# Patient Record
Sex: Male | Born: 1995 | Race: White | Marital: Single | Smoking: Never smoker
Health system: Southern US, Community
[De-identification: ages and names within clinical notes are randomized; demographics above are authoritative.]

## PROBLEM LIST (undated history)

## (undated) DIAGNOSIS — I739 Peripheral vascular disease, unspecified: Secondary | ICD-10-CM

## (undated) HISTORY — DX: Peripheral vascular disease, unspecified: I73.9

---

## 2019-04-05 ENCOUNTER — Other Ambulatory Visit: Payer: Self-pay | Admitting: Surgical Oncology

## 2019-04-05 DIAGNOSIS — K650 Generalized (acute) peritonitis: Secondary | ICD-10-CM

## 2019-04-20 ENCOUNTER — Encounter: Payer: Self-pay | Admitting: *Deleted

## 2019-04-20 ENCOUNTER — Ambulatory Visit
Admission: RE | Admit: 2019-04-20 | Discharge: 2019-04-20 | Disposition: A | Discharge status: Home / Self Care | Payer: BLUE CROSS/BLUE SHIELD | Source: Ambulatory Visit | Attending: Surgical Oncology | Admitting: Surgical Oncology

## 2019-04-20 ENCOUNTER — Other Ambulatory Visit: Payer: Self-pay

## 2019-04-20 DIAGNOSIS — K650 Generalized (acute) peritonitis: Secondary | ICD-10-CM

## 2019-04-20 HISTORY — PX: IR RADIOLOGIST EVAL & MGMT: IMG5224

## 2019-04-20 MED ORDER — IOPAMIDOL (ISOVUE-300) INJECTION 61%
125.0000 mL | Freq: Once | INTRAVENOUS | Status: AC | PRN
  Administered 2019-04-20: 125 mL via INTRAVENOUS

## 2019-04-20 NOTE — Progress Notes (Signed)
Chief Complaint: Patient was seen in consultation today for abscess drain at the request of [facility]  Referring Physician(s): Arredondo,Mark  History of Present Illness: Carlos Russell is a 23 y.o. male With a past history of appendicitis status post appendectomy.  He developed a chronic right posterior perihepatic fluid collection which was drained percutaneously on 04/02/2019.  Nearly 100 mL of frankly purulent material was aspirated at that time.  The gentleman has subsequently been maintaining his drainage catheter and flushing it once daily.  Last week, drain output was approximately 10 mL/day.  This week drain output is only 5 mL/day of serosanguineous fluid.  This corresponds with the amounts of flush being injected each day.  He denies fever, chills, abdominal or flank pain.   Allergies: Patient has no allergy information on record.  Medications: Prior to Admission medications   Not on File     No family history on file.  Social History   Socioeconomic History  . Marital status: Single    Spouse name: Not on file  . Number of children: Not on file  . Years of education: Not on file  . Highest education level: Not on file  Occupational History  . Not on file  Social Needs  . Financial resource strain: Not on file  . Food insecurity    Worry: Not on file    Inability: Not on file  . Transportation needs    Medical: Not on file    Non-medical: Not on file  Tobacco Use  . Smoking status: Not on file  Substance and Sexual Activity  . Alcohol use: Not on file  . Drug use: Not on file  . Sexual activity: Not on file  Lifestyle  . Physical activity    Days per week: Not on file    Minutes per session: Not on file  . Stress: Not on file  Relationships  . Social Herbalist on phone: Not on file    Gets together: Not on file    Attends religious service: Not on file    Active member of club or organization: Not on file    Attends  meetings of clubs or organizations: Not on file    Relationship status: Not on file  Other Topics Concern  . Not on file  Social History Narrative  . Not on file     Review of Systems: A 12 point ROS discussed and pertinent positives are indicated in the HPI above.  All other systems are negative.  Review of Systems  Vital Signs: There were no vitals taken for this visit.  Physical Exam Vitals signs reviewed.  Constitutional:      General: He is not in acute distress.    Appearance: Normal appearance. He is not ill-appearing.  HENT:     Head: Normocephalic and atraumatic.  Eyes:     General: No scleral icterus. Cardiovascular:     Rate and Rhythm: Normal rate.  Pulmonary:     Effort: Pulmonary effort is normal.  Abdominal:     General: Abdomen is flat.     Palpations: Abdomen is soft.     Tenderness: There is no abdominal tenderness.  Skin:    General: Skin is warm and dry.  Neurological:     Mental Status: He is alert and oriented to person, place, and time.  Psychiatric:        Mood and Affect: Mood normal.        Behavior: Behavior  normal.     Imaging: Ct Abdomen W Contrast  Result Date: 04/20/2019 CLINICAL DATA:  23 year old male with a history of right upper quadrant posterior perihepatic abscess treated by percutaneous drain placement on 04/02/2019. Patient presents today for follow-up imaging. EXAM: CT ABDOMEN WITH CONTRAST TECHNIQUE: Multidetector CT imaging of the abdomen was performed using the standard protocol following bolus administration of intravenous contrast. CONTRAST:  125mL ISOVUE-300 IOPAMIDOL (ISOVUE-300) INJECTION 61% COMPARISON:  Prior CT scan 04/01/2019 FINDINGS: Lower chest: The lung bases are clear. Visualized cardiac structures are within normal limits for size. No pericardial effusion. Unremarkable visualized distal thoracic esophagus. Hepatobiliary: Normal hepatic contour and morphology. No discrete hepatic lesions. Minimal residual  low-attenuation in hepatic segment 6 likely representing reactive edema/phlegmon. This region measures approximately 3.2 by 2.0 cm. Normal appearance of the gallbladder. No intra or extrahepatic biliary ductal dilatation. Pancreas: Unremarkable. No pancreatic ductal dilatation or surrounding inflammatory changes. Spleen: Normal in size without focal abnormality. Adrenals/Urinary Tract: Adrenal glands are unremarkable. Kidneys are normal, without renal calculi, focal lesion, or hydronephrosis. Bladder is unremarkable. Stomach/Bowel: Stomach is within normal limits. Surgical changes of prior appendectomy. No evidence of bowel wall thickening, distention, or inflammatory changes. Vascular/Lymphatic: No significant vascular findings are present. No enlarged abdominal or pelvic lymph nodes. Other: Right upper quadrant posterior perihepatic drainage catheter remains in good position. There is no visible undrained fluid collection. Soft tissue thickening is again present in the retroperitoneal space with adjacent reactive edema and the liver. No abdominal wall hernia. No ascites. Musculoskeletal: No acute fracture or aggressive appearing lytic or blastic osseous lesion. IMPRESSION: Interval resolution of right posterior perihepatic abscess cavity with well-positioned drainage catheter in place. There is a small amount of residual low-attenuation within the hepatic parenchyma likely representing secondary inflammation or phlegmon. No undrained abscess evident. Electronically Signed   By: Malachy MoanHeath  Randy Castrejon M.D.   On: 04/20/2019 12:56    Labs:  CBC: No results for input(s): WBC, HGB, HCT, PLT in the last 8760 hours.  COAGS: No results for input(s): INR, APTT in the last 8760 hours.  BMP: No results for input(s): NA, K, CL, CO2, GLUCOSE, BUN, CALCIUM, CREATININE, GFRNONAA, GFRAA in the last 8760 hours.  Invalid input(s): CMP  LIVER FUNCTION TESTS: No results for input(s): BILITOT, AST, ALT, ALKPHOS, PROT,  ALBUMIN in the last 8760 hours.  TUMOR MARKERS: No results for input(s): AFPTM, CEA, CA199, CHROMGRNA in the last 8760 hours.  Assessment and Plan:  Resolved posterior perihepatic abscess.  The drainage catheter was removed.   Electronically Signed: Malachy MoanHeath Aayra Hornbaker 04/20/2019, 1:18 PM   I spent a total of 15 Minutes in face to face in clinical consultation, greater than 50% of which was counseling/coordinating care for perihepatic abscess with drain in place.

## 2020-04-25 IMAGING — CT CT ABDOMEN WITH CONTRAST
2 of 4 series · 13 of 46 positions shown, 15 images · IV contrast (iopamidol)
Comparison: Prior CT scan 04/01/2019

CLINICAL DATA: 22-year-old male with a history of right upper
quadrant posterior perihepatic abscess treated by percutaneous drain
placement on 04/02/2019. Patient presents today for follow-up
imaging.

EXAM:
CT ABDOMEN WITH CONTRAST
TECHNIQUE: Multidetector CT imaging of the abdomen was performed using the
standard protocol following bolus administration of intravenous
contrast.
CONTRAST:  125mL NHEV5J-GWW IOPAMIDOL (NHEV5J-GWW) INJECTION 61%

[Series 2: abd with 5.00 br40 s3 axial · axial · 0.71mm/px · z∈[+1340,+1615]mm · 10 of 63 slices shown, 12 images]
[im 4/63  soft-tissue]
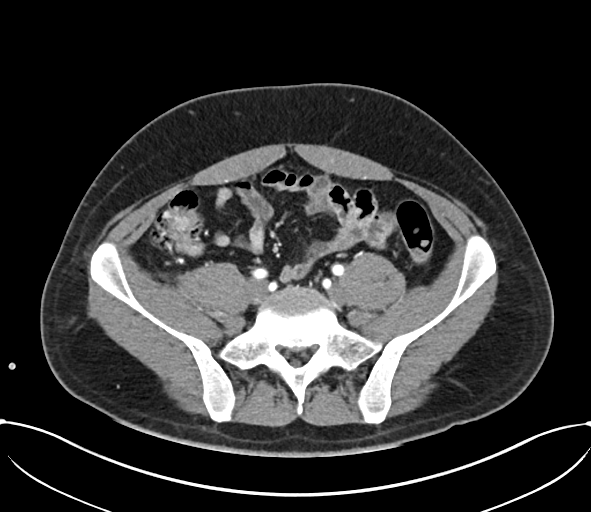
[im 4/63  bone]
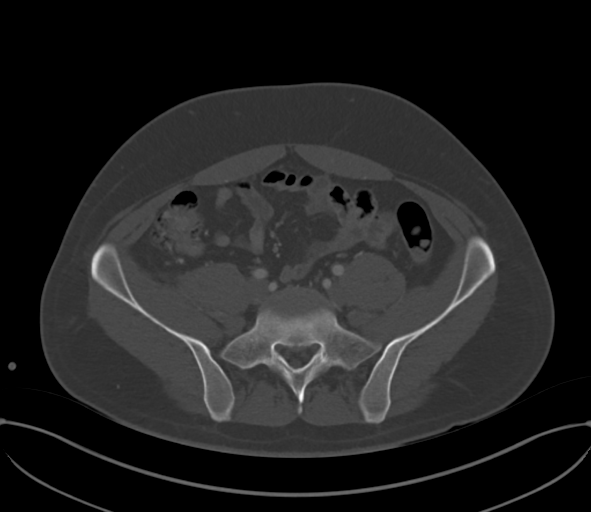
[im 10/63  soft-tissue]
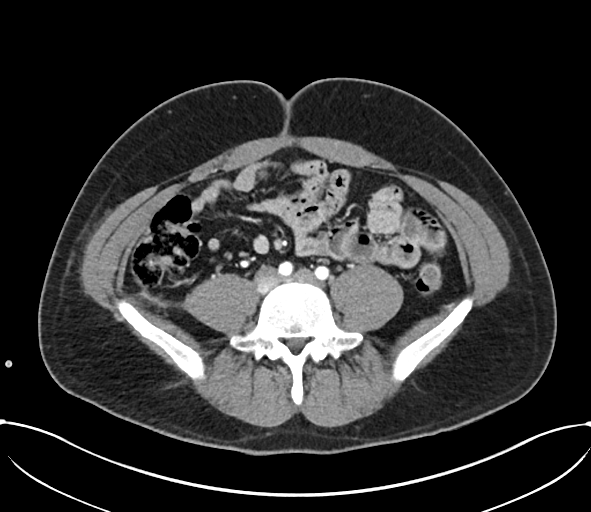
[im 16/63  soft-tissue]
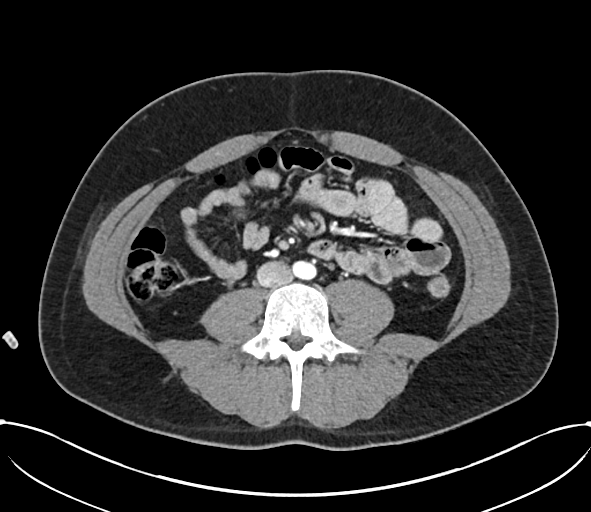
[im 22/63  soft-tissue]
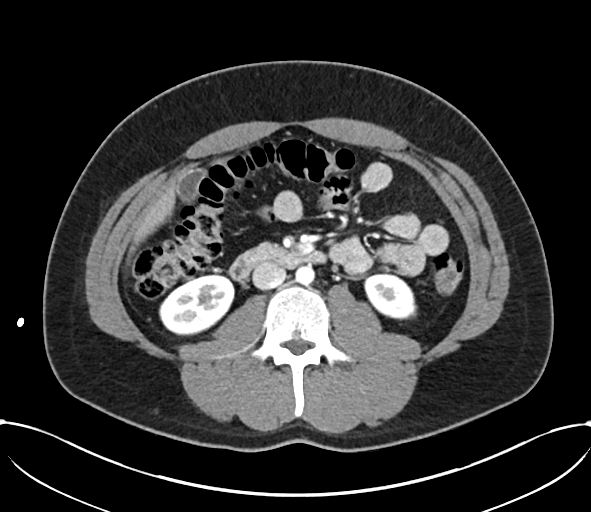
[im 28/63  soft-tissue]
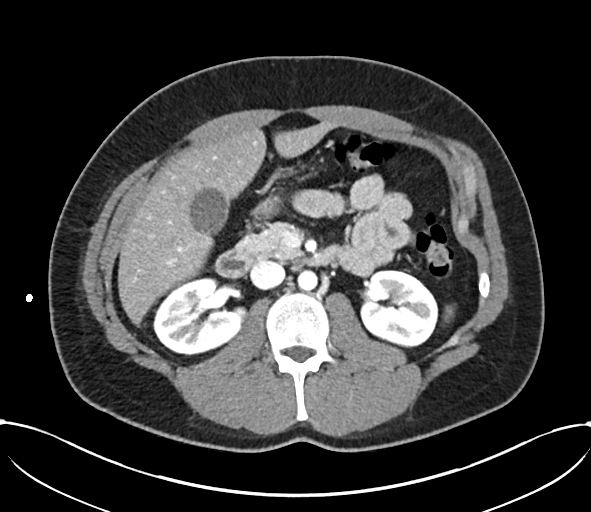
[im 35/63  soft-tissue]
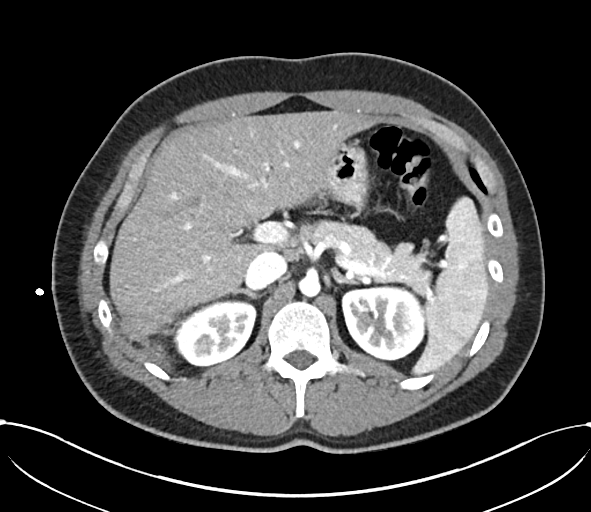
[im 41/63  soft-tissue]
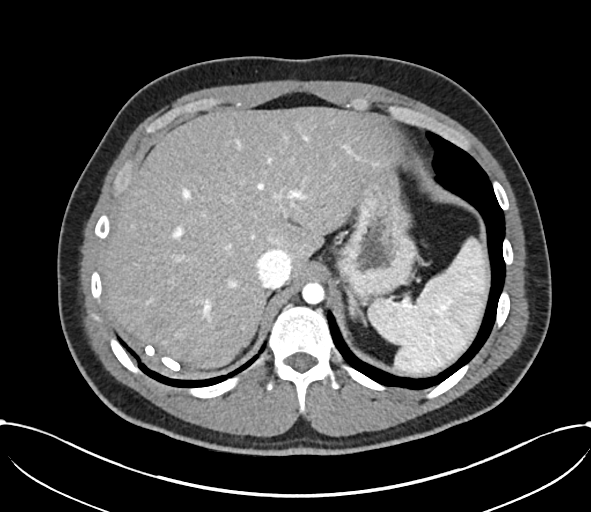
[im 47/63  soft-tissue]
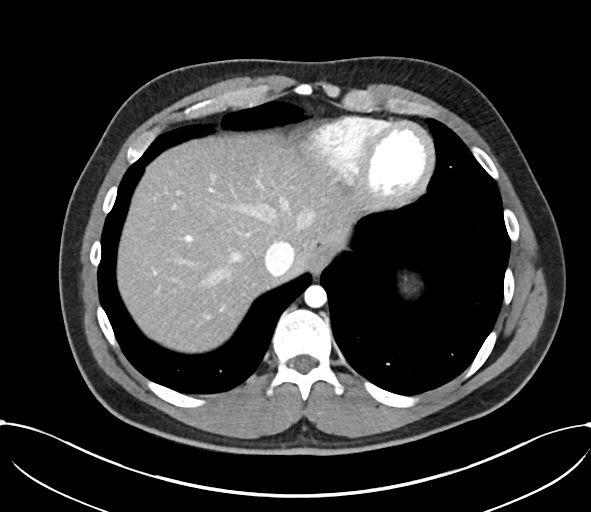
[im 53/63  soft-tissue]
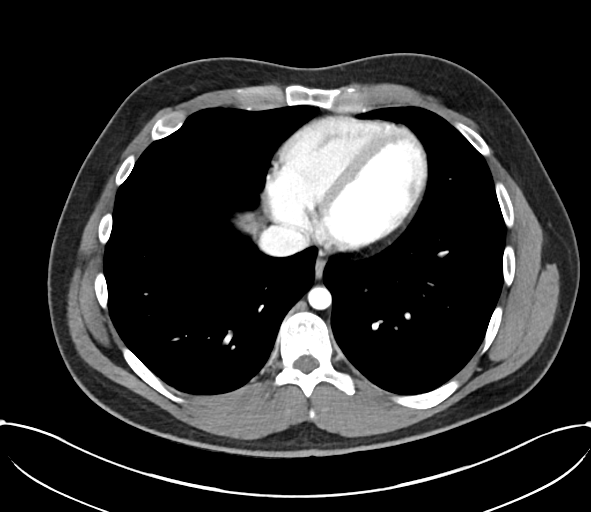
[im 53/63  bone]
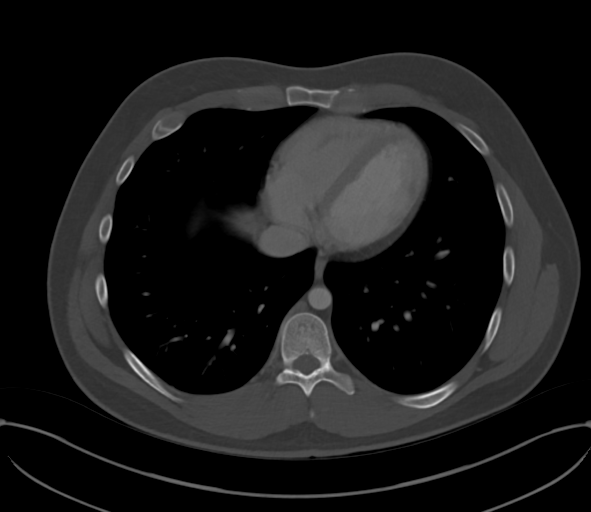
[im 59/63  soft-tissue]
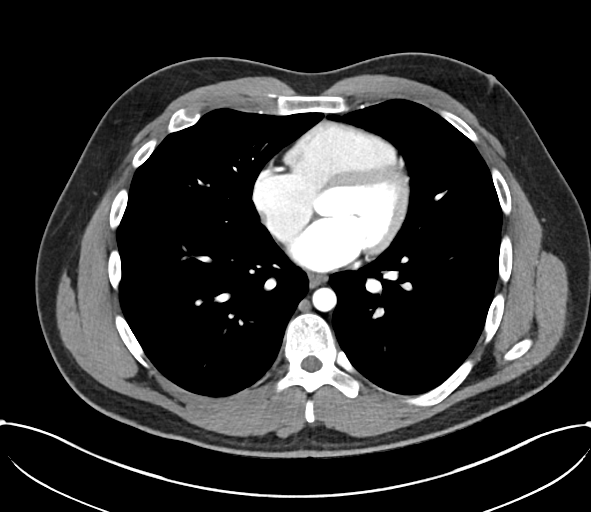

[Series 8: abd with 2.00 br40 s3 cor · coronal · 0.62mm/px · 3 of 182 slices shown]
[im 61/182  soft-tissue]
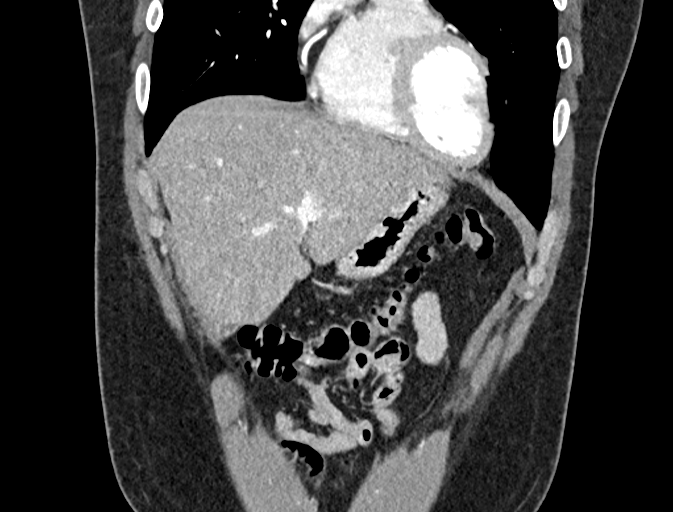
[im 81/182  soft-tissue]
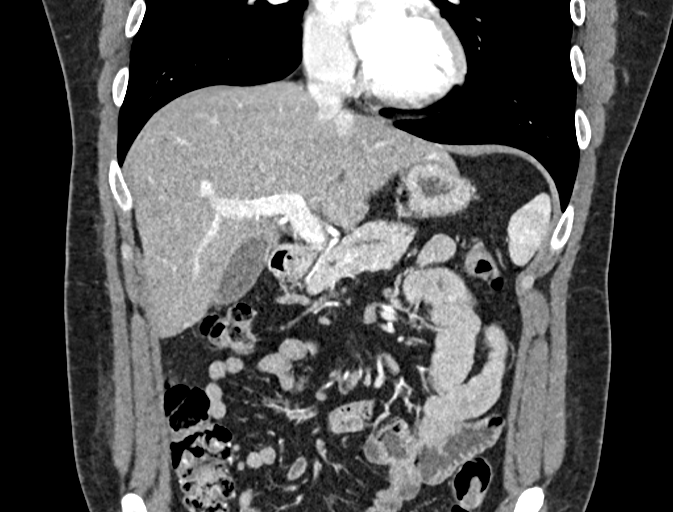
[im 101/182  soft-tissue]
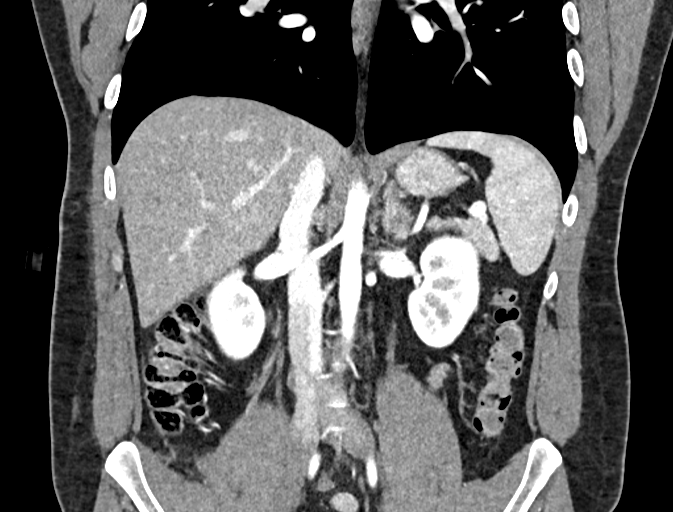

[13 of 46 positions shown; findings below may reference images not displayed]

FINDINGS: Lower chest: The lung bases are clear. Visualized cardiac structures
are within normal limits for size. No pericardial effusion.
Unremarkable visualized distal thoracic esophagus.

Hepatobiliary: Normal hepatic contour and morphology. No discrete
hepatic lesions. Minimal residual low-attenuation in hepatic segment
6 likely representing reactive edema/phlegmon. This region measures
approximately 3.2 by 2.0 cm. Normal appearance of the gallbladder.
No intra or extrahepatic biliary ductal dilatation.

Pancreas: Unremarkable. No pancreatic ductal dilatation or
surrounding inflammatory changes.

Spleen: Normal in size without focal abnormality.

Adrenals/Urinary Tract: Adrenal glands are unremarkable. Kidneys are
normal, without renal calculi, focal lesion, or hydronephrosis.
Bladder is unremarkable.

Stomach/Bowel: Stomach is within normal limits. Surgical changes of
prior appendectomy. No evidence of bowel wall thickening,
distention, or inflammatory changes.

Vascular/Lymphatic: No significant vascular findings are present. No
enlarged abdominal or pelvic lymph nodes.

Other: Right upper quadrant posterior perihepatic drainage catheter
remains in good position. There is no visible undrained fluid
collection. Soft tissue thickening is again present in the
retroperitoneal space with adjacent reactive edema and the liver. No
abdominal wall hernia. No ascites.

Musculoskeletal: No acute fracture or aggressive appearing lytic or
blastic osseous lesion.
IMPRESSION: Interval resolution of right posterior perihepatic abscess cavity
with well-positioned drainage catheter in place. There is a small
amount of residual low-attenuation within the hepatic parenchyma
likely representing secondary inflammation or phlegmon. No undrained
abscess evident.

## 2023-12-23 DIAGNOSIS — Z131 Encounter for screening for diabetes mellitus: Secondary | ICD-10-CM | POA: Diagnosis not present

## 2023-12-23 DIAGNOSIS — Z13220 Encounter for screening for lipoid disorders: Secondary | ICD-10-CM | POA: Diagnosis not present

## 2023-12-23 DIAGNOSIS — M542 Cervicalgia: Secondary | ICD-10-CM | POA: Diagnosis not present

## 2023-12-23 DIAGNOSIS — Z0000 Encounter for general adult medical examination without abnormal findings: Secondary | ICD-10-CM | POA: Diagnosis not present

## 2023-12-23 DIAGNOSIS — E669 Obesity, unspecified: Secondary | ICD-10-CM | POA: Diagnosis not present

## 2024-04-13 DIAGNOSIS — M549 Dorsalgia, unspecified: Secondary | ICD-10-CM | POA: Diagnosis not present

## 2024-04-13 DIAGNOSIS — L03012 Cellulitis of left finger: Secondary | ICD-10-CM | POA: Diagnosis not present

## 2024-04-13 DIAGNOSIS — S60362A Insect bite (nonvenomous) of left thumb, initial encounter: Secondary | ICD-10-CM | POA: Diagnosis not present

## 2024-05-04 DIAGNOSIS — E663 Overweight: Secondary | ICD-10-CM | POA: Diagnosis not present

## 2024-05-04 DIAGNOSIS — M545 Low back pain, unspecified: Secondary | ICD-10-CM | POA: Diagnosis not present

## 2024-05-04 DIAGNOSIS — Z6834 Body mass index (BMI) 34.0-34.9, adult: Secondary | ICD-10-CM | POA: Diagnosis not present

## 2024-05-25 DIAGNOSIS — M545 Low back pain, unspecified: Secondary | ICD-10-CM | POA: Diagnosis not present

## 2024-06-26 ENCOUNTER — Emergency Department (HOSPITAL_COMMUNITY): Payer: Self-pay

## 2024-06-26 ENCOUNTER — Inpatient Hospital Stay (HOSPITAL_COMMUNITY)
Admission: EM | Admit: 2024-06-26 | Discharge: 2024-07-07 | DRG: 907 | Disposition: A | Discharge status: Home / Self Care | Payer: Self-pay | Attending: Surgery | Admitting: Surgery

## 2024-06-26 DIAGNOSIS — Z91041 Radiographic dye allergy status: Secondary | ICD-10-CM | POA: Diagnosis not present

## 2024-06-26 DIAGNOSIS — X749XXA Intentional self-harm by unspecified firearm discharge, initial encounter: Secondary | ICD-10-CM | POA: Diagnosis not present

## 2024-06-26 DIAGNOSIS — S069XAA Unspecified intracranial injury with loss of consciousness status unknown, initial encounter: Secondary | ICD-10-CM | POA: Diagnosis present

## 2024-06-26 DIAGNOSIS — T79A21A Traumatic compartment syndrome of right lower extremity, initial encounter: Secondary | ICD-10-CM | POA: Diagnosis present

## 2024-06-26 DIAGNOSIS — E876 Hypokalemia: Secondary | ICD-10-CM | POA: Diagnosis present

## 2024-06-26 DIAGNOSIS — Z23 Encounter for immunization: Secondary | ICD-10-CM | POA: Diagnosis not present

## 2024-06-26 DIAGNOSIS — Z87820 Personal history of traumatic brain injury: Secondary | ICD-10-CM

## 2024-06-26 DIAGNOSIS — J9601 Acute respiratory failure with hypoxia: Secondary | ICD-10-CM | POA: Diagnosis not present

## 2024-06-26 DIAGNOSIS — N178 Other acute kidney failure: Secondary | ICD-10-CM | POA: Diagnosis not present

## 2024-06-26 DIAGNOSIS — D62 Acute posthemorrhagic anemia: Secondary | ICD-10-CM | POA: Diagnosis not present

## 2024-06-26 DIAGNOSIS — N179 Acute kidney failure, unspecified: Secondary | ICD-10-CM | POA: Diagnosis present

## 2024-06-26 DIAGNOSIS — S71101A Unspecified open wound, right thigh, initial encounter: Secondary | ICD-10-CM | POA: Diagnosis not present

## 2024-06-26 DIAGNOSIS — F6381 Intermittent explosive disorder: Secondary | ICD-10-CM | POA: Diagnosis present

## 2024-06-26 DIAGNOSIS — M21371 Foot drop, right foot: Secondary | ICD-10-CM | POA: Diagnosis present

## 2024-06-26 DIAGNOSIS — F322 Major depressive disorder, single episode, severe without psychotic features: Secondary | ICD-10-CM | POA: Diagnosis not present

## 2024-06-26 DIAGNOSIS — F3131 Bipolar disorder, current episode depressed, mild: Secondary | ICD-10-CM | POA: Diagnosis present

## 2024-06-26 DIAGNOSIS — S71131A Puncture wound without foreign body, right thigh, initial encounter: Secondary | ICD-10-CM | POA: Diagnosis present

## 2024-06-26 DIAGNOSIS — Z7982 Long term (current) use of aspirin: Secondary | ICD-10-CM

## 2024-06-26 DIAGNOSIS — S86901A Unspecified injury of unspecified muscle(s) and tendon(s) at lower leg level, right leg, initial encounter: Secondary | ICD-10-CM | POA: Diagnosis not present

## 2024-06-26 DIAGNOSIS — S71141A Puncture wound with foreign body, right thigh, initial encounter: Secondary | ICD-10-CM | POA: Diagnosis not present

## 2024-06-26 DIAGNOSIS — R58 Hemorrhage, not elsewhere classified: Secondary | ICD-10-CM | POA: Diagnosis not present

## 2024-06-26 DIAGNOSIS — E875 Hyperkalemia: Secondary | ICD-10-CM | POA: Diagnosis not present

## 2024-06-26 DIAGNOSIS — Z79899 Other long term (current) drug therapy: Secondary | ICD-10-CM | POA: Diagnosis not present

## 2024-06-26 DIAGNOSIS — S75901A Unspecified injury of unspecified blood vessel at hip and thigh level, right leg, initial encounter: Secondary | ICD-10-CM | POA: Diagnosis not present

## 2024-06-26 DIAGNOSIS — Z95820 Peripheral vascular angioplasty status with implants and grafts: Secondary | ICD-10-CM | POA: Diagnosis not present

## 2024-06-26 DIAGNOSIS — D696 Thrombocytopenia, unspecified: Secondary | ICD-10-CM | POA: Diagnosis present

## 2024-06-26 DIAGNOSIS — F3160 Bipolar disorder, current episode mixed, unspecified: Secondary | ICD-10-CM | POA: Diagnosis present

## 2024-06-26 DIAGNOSIS — S75021A Major laceration of femoral artery, right leg, initial encounter: Principal | ICD-10-CM | POA: Diagnosis present

## 2024-06-26 DIAGNOSIS — Z9911 Dependence on respirator [ventilator] status: Secondary | ICD-10-CM

## 2024-06-26 DIAGNOSIS — F10120 Alcohol abuse with intoxication, uncomplicated: Secondary | ICD-10-CM | POA: Diagnosis not present

## 2024-06-26 DIAGNOSIS — E781 Pure hyperglyceridemia: Secondary | ICD-10-CM | POA: Diagnosis present

## 2024-06-26 DIAGNOSIS — I959 Hypotension, unspecified: Secondary | ICD-10-CM | POA: Diagnosis present

## 2024-06-26 DIAGNOSIS — J9600 Acute respiratory failure, unspecified whether with hypoxia or hypercapnia: Secondary | ICD-10-CM | POA: Diagnosis present

## 2024-06-26 DIAGNOSIS — W3400XA Accidental discharge from unspecified firearms or gun, initial encounter: Secondary | ICD-10-CM | POA: Diagnosis not present

## 2024-06-26 DIAGNOSIS — Z791 Long term (current) use of non-steroidal anti-inflammatories (NSAID): Secondary | ICD-10-CM

## 2024-06-26 DIAGNOSIS — Y249XXA Unspecified firearm discharge, undetermined intent, initial encounter: Secondary | ICD-10-CM | POA: Diagnosis not present

## 2024-06-26 DIAGNOSIS — Z818 Family history of other mental and behavioral disorders: Secondary | ICD-10-CM

## 2024-06-26 DIAGNOSIS — Z882 Allergy status to sulfonamides status: Secondary | ICD-10-CM | POA: Diagnosis not present

## 2024-06-26 DIAGNOSIS — Y908 Blood alcohol level of 240 mg/100 ml or more: Secondary | ICD-10-CM | POA: Diagnosis present

## 2024-06-26 DIAGNOSIS — S7011XA Contusion of right thigh, initial encounter: Secondary | ICD-10-CM | POA: Diagnosis not present

## 2024-06-26 DIAGNOSIS — R000 Tachycardia, unspecified: Secondary | ICD-10-CM | POA: Diagnosis present

## 2024-06-26 DIAGNOSIS — F10129 Alcohol abuse with intoxication, unspecified: Secondary | ICD-10-CM | POA: Diagnosis present

## 2024-06-26 DIAGNOSIS — Z481 Encounter for planned postprocedural wound closure: Secondary | ICD-10-CM | POA: Diagnosis not present

## 2024-06-26 DIAGNOSIS — R571 Hypovolemic shock: Secondary | ICD-10-CM | POA: Diagnosis not present

## 2024-06-26 DIAGNOSIS — I10 Essential (primary) hypertension: Secondary | ICD-10-CM | POA: Diagnosis not present

## 2024-06-26 DIAGNOSIS — S75011A Minor laceration of femoral artery, right leg, initial encounter: Secondary | ICD-10-CM | POA: Diagnosis not present

## 2024-06-26 LAB — I-STAT CG4 LACTIC ACID, ED: Lactic Acid, Venous: 3.9 mmol/L (ref 0.5–1.9)

## 2024-06-26 MED ORDER — SODIUM CHLORIDE 0.9 % IV SOLN
INTRAVENOUS | Status: AC | PRN
  Administered 2024-06-26: 1000 mL via INTRAVENOUS

## 2024-06-26 MED ORDER — FENTANYL CITRATE PF 50 MCG/ML IJ SOSY
PREFILLED_SYRINGE | INTRAMUSCULAR | Status: AC
  Administered 2024-06-26: 50 ug
  Filled 2024-06-26: qty 2

## 2024-06-27 ENCOUNTER — Other Ambulatory Visit: Payer: Self-pay

## 2024-06-27 ENCOUNTER — Emergency Department (HOSPITAL_COMMUNITY): Payer: Self-pay

## 2024-06-27 ENCOUNTER — Encounter (HOSPITAL_COMMUNITY): Admission: EM | Disposition: A | Discharge status: Home / Self Care | Payer: Self-pay | Source: Home / Self Care

## 2024-06-27 ENCOUNTER — Inpatient Hospital Stay (HOSPITAL_COMMUNITY): Payer: Self-pay

## 2024-06-27 ENCOUNTER — Emergency Department (HOSPITAL_COMMUNITY): Payer: Self-pay | Admitting: Anesthesiology

## 2024-06-27 DIAGNOSIS — Z882 Allergy status to sulfonamides status: Secondary | ICD-10-CM | POA: Diagnosis not present

## 2024-06-27 DIAGNOSIS — N179 Acute kidney failure, unspecified: Secondary | ICD-10-CM | POA: Diagnosis present

## 2024-06-27 DIAGNOSIS — J9600 Acute respiratory failure, unspecified whether with hypoxia or hypercapnia: Secondary | ICD-10-CM | POA: Diagnosis present

## 2024-06-27 DIAGNOSIS — W3400XA Accidental discharge from unspecified firearms or gun, initial encounter: Secondary | ICD-10-CM

## 2024-06-27 DIAGNOSIS — S86901A Unspecified injury of unspecified muscle(s) and tendon(s) at lower leg level, right leg, initial encounter: Secondary | ICD-10-CM | POA: Diagnosis not present

## 2024-06-27 DIAGNOSIS — F10129 Alcohol abuse with intoxication, unspecified: Secondary | ICD-10-CM | POA: Diagnosis present

## 2024-06-27 DIAGNOSIS — Z7982 Long term (current) use of aspirin: Secondary | ICD-10-CM | POA: Diagnosis not present

## 2024-06-27 DIAGNOSIS — Z9911 Dependence on respirator [ventilator] status: Secondary | ICD-10-CM

## 2024-06-27 DIAGNOSIS — M21371 Foot drop, right foot: Secondary | ICD-10-CM | POA: Diagnosis present

## 2024-06-27 DIAGNOSIS — Z91041 Radiographic dye allergy status: Secondary | ICD-10-CM | POA: Diagnosis not present

## 2024-06-27 DIAGNOSIS — F322 Major depressive disorder, single episode, severe without psychotic features: Secondary | ICD-10-CM | POA: Diagnosis not present

## 2024-06-27 DIAGNOSIS — S75901A Unspecified injury of unspecified blood vessel at hip and thigh level, right leg, initial encounter: Secondary | ICD-10-CM

## 2024-06-27 DIAGNOSIS — Z95820 Peripheral vascular angioplasty status with implants and grafts: Secondary | ICD-10-CM | POA: Diagnosis not present

## 2024-06-27 DIAGNOSIS — S75011A Minor laceration of femoral artery, right leg, initial encounter: Secondary | ICD-10-CM | POA: Diagnosis not present

## 2024-06-27 DIAGNOSIS — E875 Hyperkalemia: Secondary | ICD-10-CM

## 2024-06-27 DIAGNOSIS — F6381 Intermittent explosive disorder: Secondary | ICD-10-CM | POA: Diagnosis present

## 2024-06-27 DIAGNOSIS — X749XXA Intentional self-harm by unspecified firearm discharge, initial encounter: Secondary | ICD-10-CM | POA: Diagnosis not present

## 2024-06-27 DIAGNOSIS — Y908 Blood alcohol level of 240 mg/100 ml or more: Secondary | ICD-10-CM | POA: Diagnosis present

## 2024-06-27 DIAGNOSIS — Z791 Long term (current) use of non-steroidal anti-inflammatories (NSAID): Secondary | ICD-10-CM | POA: Diagnosis not present

## 2024-06-27 DIAGNOSIS — S71101A Unspecified open wound, right thigh, initial encounter: Secondary | ICD-10-CM | POA: Diagnosis not present

## 2024-06-27 DIAGNOSIS — D62 Acute posthemorrhagic anemia: Secondary | ICD-10-CM | POA: Diagnosis not present

## 2024-06-27 DIAGNOSIS — D696 Thrombocytopenia, unspecified: Secondary | ICD-10-CM | POA: Diagnosis present

## 2024-06-27 DIAGNOSIS — Z481 Encounter for planned postprocedural wound closure: Secondary | ICD-10-CM | POA: Diagnosis not present

## 2024-06-27 DIAGNOSIS — S71131A Puncture wound without foreign body, right thigh, initial encounter: Secondary | ICD-10-CM | POA: Diagnosis present

## 2024-06-27 DIAGNOSIS — E876 Hypokalemia: Secondary | ICD-10-CM | POA: Diagnosis present

## 2024-06-27 DIAGNOSIS — S81801A Unspecified open wound, right lower leg, initial encounter: Secondary | ICD-10-CM

## 2024-06-27 DIAGNOSIS — T79A21A Traumatic compartment syndrome of right lower extremity, initial encounter: Secondary | ICD-10-CM | POA: Diagnosis present

## 2024-06-27 DIAGNOSIS — Z87820 Personal history of traumatic brain injury: Secondary | ICD-10-CM | POA: Diagnosis not present

## 2024-06-27 DIAGNOSIS — F3160 Bipolar disorder, current episode mixed, unspecified: Secondary | ICD-10-CM | POA: Diagnosis present

## 2024-06-27 DIAGNOSIS — S7011XA Contusion of right thigh, initial encounter: Secondary | ICD-10-CM | POA: Diagnosis not present

## 2024-06-27 DIAGNOSIS — E781 Pure hyperglyceridemia: Secondary | ICD-10-CM | POA: Diagnosis present

## 2024-06-27 DIAGNOSIS — Z23 Encounter for immunization: Secondary | ICD-10-CM | POA: Diagnosis not present

## 2024-06-27 DIAGNOSIS — R000 Tachycardia, unspecified: Secondary | ICD-10-CM | POA: Diagnosis present

## 2024-06-27 DIAGNOSIS — Y249XXA Unspecified firearm discharge, undetermined intent, initial encounter: Secondary | ICD-10-CM | POA: Diagnosis not present

## 2024-06-27 DIAGNOSIS — I959 Hypotension, unspecified: Secondary | ICD-10-CM | POA: Diagnosis present

## 2024-06-27 DIAGNOSIS — Z79899 Other long term (current) drug therapy: Secondary | ICD-10-CM | POA: Diagnosis not present

## 2024-06-27 DIAGNOSIS — S75021A Major laceration of femoral artery, right leg, initial encounter: Secondary | ICD-10-CM | POA: Diagnosis present

## 2024-06-27 HISTORY — PX: THIGH FASCIOTOMY: SHX6718

## 2024-06-27 HISTORY — PX: LOWER EXTREMITY ANGIOGRAM: SHX5508

## 2024-06-27 HISTORY — PX: HEMATOMA EVACUATION: SHX5118

## 2024-06-27 LAB — CBC
HCT: 36.4 % — ABNORMAL LOW (ref 39.0–52.0)
HCT: 27.4 % — ABNORMAL LOW (ref 39.0–52.0)
Hemoglobin: 12.2 g/dL — ABNORMAL LOW (ref 13.0–17.0)
Hemoglobin: 9.4 g/dL — ABNORMAL LOW (ref 13.0–17.0)
MCH: 30.3 pg (ref 26.0–34.0)
MCH: 31 pg (ref 26.0–34.0)
MCHC: 33.5 g/dL (ref 30.0–36.0)
MCHC: 34.3 g/dL (ref 30.0–36.0)
MCV: 90.3 fL (ref 80.0–100.0)
MCV: 90.4 fL (ref 80.0–100.0)
Platelets: 193 K/uL (ref 150–400)
Platelets: 148 K/uL — ABNORMAL LOW (ref 150–400)
RBC: 4.03 MIL/uL — ABNORMAL LOW (ref 4.22–5.81)
RBC: 3.03 MIL/uL — ABNORMAL LOW (ref 4.22–5.81)
RDW: 14.3 % (ref 11.5–15.5)
RDW: 14.3 % (ref 11.5–15.5)
WBC: 13.4 K/uL — ABNORMAL HIGH (ref 4.0–10.5)
WBC: 9.8 K/uL (ref 4.0–10.5)
nRBC: 0 % (ref 0.0–0.2)
nRBC: 0 % (ref 0.0–0.2)

## 2024-06-27 LAB — DIC (DISSEMINATED INTRAVASCULAR COAGULATION)PANEL
D-Dimer, Quant: 2.86 ug{FEU}/mL — ABNORMAL HIGH (ref 0.00–0.50)
Fibrinogen: 170 mg/dL — ABNORMAL LOW (ref 210–475)
INR: 1.1 (ref 0.8–1.2)
Platelets: 207 K/uL (ref 150–400)
Prothrombin Time: 14.7 s (ref 11.4–15.2)
Smear Review: NONE SEEN
aPTT: 25 s (ref 24–36)

## 2024-06-27 LAB — GLUCOSE, CAPILLARY
Glucose-Capillary: 142 mg/dL — ABNORMAL HIGH (ref 70–99)
Glucose-Capillary: 164 mg/dL — ABNORMAL HIGH (ref 70–99)
Glucose-Capillary: 157 mg/dL — ABNORMAL HIGH (ref 70–99)
Glucose-Capillary: 117 mg/dL — ABNORMAL HIGH (ref 70–99)
Glucose-Capillary: 146 mg/dL — ABNORMAL HIGH (ref 70–99)

## 2024-06-27 LAB — POCT I-STAT 7, (LYTES, BLD GAS, ICA,H+H)
Acid-base deficit: 7 mmol/L — ABNORMAL HIGH (ref 0.0–2.0)
Acid-base deficit: 7 mmol/L — ABNORMAL HIGH (ref 0.0–2.0)
Bicarbonate: 21.1 mmol/L (ref 20.0–28.0)
Bicarbonate: 19.8 mmol/L — ABNORMAL LOW (ref 20.0–28.0)
Calcium, Ion: 1.17 mmol/L (ref 1.15–1.40)
Calcium, Ion: 1.14 mmol/L — ABNORMAL LOW (ref 1.15–1.40)
HCT: 36 % — ABNORMAL LOW (ref 39.0–52.0)
HCT: 34 % — ABNORMAL LOW (ref 39.0–52.0)
Hemoglobin: 12.2 g/dL — ABNORMAL LOW (ref 13.0–17.0)
Hemoglobin: 11.6 g/dL — ABNORMAL LOW (ref 13.0–17.0)
O2 Saturation: 99 %
O2 Saturation: 99 %
Patient temperature: 98.6
Patient temperature: 36.8
Potassium: 6.5 mmol/L (ref 3.5–5.1)
Potassium: 6.1 mmol/L — ABNORMAL HIGH (ref 3.5–5.1)
Sodium: 139 mmol/L (ref 135–145)
Sodium: 139 mmol/L (ref 135–145)
TCO2: 23 mmol/L (ref 22–32)
TCO2: 21 mmol/L — ABNORMAL LOW (ref 22–32)
pCO2 arterial: 55.2 mmHg — ABNORMAL HIGH (ref 32–48)
pCO2 arterial: 45.1 mmHg (ref 32–48)
pH, Arterial: 7.191 — CL (ref 7.35–7.45)
pH, Arterial: 7.25 — ABNORMAL LOW (ref 7.35–7.45)
pO2, Arterial: 145 mmHg — ABNORMAL HIGH (ref 83–108)
pO2, Arterial: 141 mmHg — ABNORMAL HIGH (ref 83–108)

## 2024-06-27 LAB — CBC WITH DIFFERENTIAL/PLATELET
Abs Immature Granulocytes: 0.01 K/uL (ref 0.00–0.07)
Basophils Absolute: 0 K/uL (ref 0.0–0.1)
Basophils Relative: 0 %
Eosinophils Absolute: 0.2 K/uL (ref 0.0–0.5)
Eosinophils Relative: 3 %
HCT: 36.1 % — ABNORMAL LOW (ref 39.0–52.0)
Hemoglobin: 11.7 g/dL — ABNORMAL LOW (ref 13.0–17.0)
Immature Granulocytes: 0 %
Lymphocytes Relative: 51 %
Lymphs Abs: 3.6 K/uL (ref 0.7–4.0)
MCH: 30.6 pg (ref 26.0–34.0)
MCHC: 32.4 g/dL (ref 30.0–36.0)
MCV: 94.5 fL (ref 80.0–100.0)
Monocytes Absolute: 0.7 K/uL (ref 0.1–1.0)
Monocytes Relative: 10 %
Neutro Abs: 2.6 K/uL (ref 1.7–7.7)
Neutrophils Relative %: 36 %
Platelets: 227 K/uL (ref 150–400)
RBC: 3.82 MIL/uL — ABNORMAL LOW (ref 4.22–5.81)
RDW: 13.4 % (ref 11.5–15.5)
WBC: 7.2 K/uL (ref 4.0–10.5)
nRBC: 0 % (ref 0.0–0.2)

## 2024-06-27 LAB — BASIC METABOLIC PANEL WITH GFR
Anion gap: 11 (ref 5–15)
Anion gap: 10 (ref 5–15)
BUN: 8 mg/dL (ref 6–20)
BUN: 11 mg/dL (ref 6–20)
CO2: 19 mmol/L — ABNORMAL LOW (ref 22–32)
CO2: 21 mmol/L — ABNORMAL LOW (ref 22–32)
Calcium: 7.7 mg/dL — ABNORMAL LOW (ref 8.9–10.3)
Calcium: 8.1 mg/dL — ABNORMAL LOW (ref 8.9–10.3)
Chloride: 108 mmol/L (ref 98–111)
Chloride: 107 mmol/L (ref 98–111)
Creatinine, Ser: 1.44 mg/dL — ABNORMAL HIGH (ref 0.61–1.24)
Creatinine, Ser: 1.34 mg/dL — ABNORMAL HIGH (ref 0.61–1.24)
GFR, Estimated: >60 mL/min (ref >60)
GFR, Estimated: >60 mL/min (ref >60)
Glucose, Bld: 138 mg/dL — ABNORMAL HIGH (ref 70–99)
Glucose, Bld: 139 mg/dL — ABNORMAL HIGH (ref 70–99)
Potassium: 6.2 mmol/L — ABNORMAL HIGH (ref 3.5–5.1)
Potassium: 3.7 mmol/L (ref 3.5–5.1)
Sodium: 138 mmol/L (ref 135–145)
Sodium: 138 mmol/L (ref 135–145)

## 2024-06-27 LAB — MRSA NEXT GEN BY PCR, NASAL: MRSA by PCR Next Gen: NOT DETECTED

## 2024-06-27 LAB — COMPREHENSIVE METABOLIC PANEL WITH GFR
ALT: 114 U/L — ABNORMAL HIGH (ref 0–44)
AST: 56 U/L — ABNORMAL HIGH (ref 15–41)
Albumin: 3 g/dL — ABNORMAL LOW (ref 3.5–5.0)
Alkaline Phosphatase: 37 U/L — ABNORMAL LOW (ref 38–126)
Anion gap: 14 (ref 5–15)
BUN: 8 mg/dL (ref 6–20)
CO2: 20 mmol/L — ABNORMAL LOW (ref 22–32)
Calcium: 7.8 mg/dL — ABNORMAL LOW (ref 8.9–10.3)
Chloride: 110 mmol/L (ref 98–111)
Creatinine, Ser: 1.59 mg/dL — ABNORMAL HIGH (ref 0.61–1.24)
GFR, Estimated: 29 mL/min — ABNORMAL LOW (ref >60)
Glucose, Bld: 101 mg/dL — ABNORMAL HIGH (ref 70–99)
Potassium: 3.3 mmol/L — ABNORMAL LOW (ref 3.5–5.1)
Sodium: 144 mmol/L (ref 135–145)
Total Bilirubin: 0.3 mg/dL (ref 0.0–1.2)
Total Protein: 5.4 g/dL — ABNORMAL LOW (ref 6.5–8.1)

## 2024-06-27 LAB — PREPARE RBC (CROSSMATCH)

## 2024-06-27 LAB — URINALYSIS, ROUTINE W REFLEX MICROSCOPIC
Bilirubin Urine: NEGATIVE
Glucose, UA: NEGATIVE mg/dL
Hgb urine dipstick: NEGATIVE
Ketones, ur: NEGATIVE mg/dL
Leukocytes,Ua: NEGATIVE
Nitrite: NEGATIVE
Protein, ur: NEGATIVE mg/dL
Specific Gravity, Urine: >1.046 — ABNORMAL HIGH (ref 1.005–1.030)
pH: 5 (ref 5.0–8.0)

## 2024-06-27 LAB — PROTIME-INR
INR: 1.1 (ref 0.8–1.2)
Prothrombin Time: 15 s (ref 11.4–15.2)

## 2024-06-27 LAB — I-STAT CHEM 8, ED
BUN: 8 mg/dL (ref 6–20)
Calcium, Ion: 1.04 mmol/L — ABNORMAL LOW (ref 1.15–1.40)
Chloride: 109 mmol/L (ref 98–111)
Creatinine, Ser: 1.8 mg/dL — ABNORMAL HIGH (ref 0.61–1.24)
Glucose, Bld: 94 mg/dL (ref 70–99)
HCT: 33 % — ABNORMAL LOW (ref 39.0–52.0)
Hemoglobin: 11.2 g/dL — ABNORMAL LOW (ref 13.0–17.0)
Potassium: 3.3 mmol/L — ABNORMAL LOW (ref 3.5–5.1)
Sodium: 145 mmol/L (ref 135–145)
TCO2: 19 mmol/L — ABNORMAL LOW (ref 22–32)

## 2024-06-27 LAB — MAGNESIUM: Magnesium: 1.9 mg/dL (ref 1.7–2.4)

## 2024-06-27 LAB — RAPID URINE DRUG SCREEN, HOSP PERFORMED
Amphetamines: NOT DETECTED
Barbiturates: NOT DETECTED
Benzodiazepines: POSITIVE — AB
Cocaine: NOT DETECTED
Opiates: NOT DETECTED
Tetrahydrocannabinol: POSITIVE — AB

## 2024-06-27 LAB — HIV ANTIBODY (ROUTINE TESTING W REFLEX): HIV Screen 4th Generation wRfx: NONREACTIVE

## 2024-06-27 LAB — PHOSPHORUS: Phosphorus: 3.7 mg/dL (ref 2.5–4.6)

## 2024-06-27 LAB — ETHANOL: Alcohol, Ethyl (B): 246 mg/dL — ABNORMAL HIGH (ref <15)

## 2024-06-27 LAB — ABO/RH: ABO/RH(D): B POS

## 2024-06-27 LAB — APTT: aPTT: 25 s (ref 24–36)

## 2024-06-27 LAB — HEMOGLOBIN A1C
Hgb A1c MFr Bld: 4.6 % — ABNORMAL LOW (ref 4.8–5.6)
Mean Plasma Glucose: 85.32 mg/dL

## 2024-06-27 LAB — CK: Total CK: 1544 U/L — ABNORMAL HIGH (ref 49–397)

## 2024-06-27 SURGERY — ANGIOGRAM, LOWER EXTREMITY
Anesthesia: General | Laterality: Right

## 2024-06-27 MED ORDER — LACTATED RINGERS IV SOLN: INTRAVENOUS | Status: DC | PRN

## 2024-06-27 MED ORDER — LACTATED RINGERS IV BOLUS
1000.0000 mL | Freq: Once | INTRAVENOUS | Status: AC
  Administered 2024-06-27: 1000 mL via INTRAVENOUS

## 2024-06-27 MED ORDER — SODIUM CHLORIDE 0.9 % IV SOLN
INTRAVENOUS | Status: DC | PRN
  Administered 2024-06-27: 5 mg/h via INTRAVENOUS

## 2024-06-27 MED ORDER — POLYETHYLENE GLYCOL 3350 17 G PO PACK: 17.0000 g | PACK | Freq: Every day | ORAL | Status: DC | PRN

## 2024-06-27 MED ORDER — HEPARIN 6000 UNIT IRRIGATION SOLUTION
Status: DC | PRN
  Administered 2024-06-27: 1

## 2024-06-27 MED ORDER — EPINEPHRINE 0.3 MG/0.3ML IJ SOAJ
INTRAMUSCULAR | Status: AC
  Filled 2024-06-27: qty 0.3

## 2024-06-27 MED ORDER — FENTANYL 2500MCG IN NS 250ML (10MCG/ML) PREMIX INFUSION
0.0000 ug/h | INTRAVENOUS | Status: DC
  Administered 2024-06-27: 200 ug/h via INTRAVENOUS
  Administered 2024-06-27 – 2024-06-28 (×2): 300 ug/h via INTRAVENOUS
  Filled 2024-06-27 (×3): qty 250

## 2024-06-27 MED ORDER — SODIUM CHLORIDE 0.9% IV SOLUTION: Freq: Once | INTRAVENOUS | Status: DC

## 2024-06-27 MED ORDER — ORAL CARE MOUTH RINSE: 15.0000 mL | OROMUCOSAL | Status: DC | PRN

## 2024-06-27 MED ORDER — INSULIN ASPART 100 UNIT/ML IJ SOLN
0.0000 [IU] | INTRAMUSCULAR | Status: DC
  Administered 2024-06-27: 2 [IU] via SUBCUTANEOUS
  Administered 2024-06-28: 1 [IU] via SUBCUTANEOUS

## 2024-06-27 MED ORDER — MIDAZOLAM HCL 2 MG/2ML IJ SOLN
INTRAMUSCULAR | Status: AC
  Administered 2024-06-27: 2 mg via INTRAVENOUS
  Filled 2024-06-27: qty 2

## 2024-06-27 MED ORDER — POLYETHYLENE GLYCOL 3350 17 G PO PACK
17.0000 g | PACK | Freq: Every day | ORAL | Status: DC
Start: 2024-06-27 — End: 2024-06-28
  Administered 2024-06-27: 17 g
  Filled 2024-06-27: qty 1

## 2024-06-27 MED ORDER — PHENYLEPHRINE 80 MCG/ML (10ML) SYRINGE FOR IV PUSH (FOR BLOOD PRESSURE SUPPORT)
PREFILLED_SYRINGE | INTRAVENOUS | Status: AC
  Filled 2024-06-27: qty 10

## 2024-06-27 MED ORDER — PHENYLEPHRINE HCL (PRESSORS) 10 MG/ML IV SOLN
INTRAVENOUS | Status: AC
  Filled 2024-06-27: qty 1

## 2024-06-27 MED ORDER — DOCUSATE SODIUM 50 MG/5ML PO LIQD
100.0000 mg | Freq: Two times a day (BID) | ORAL | Status: DC
  Administered 2024-06-27 (×2): 100 mg
  Filled 2024-06-27 (×2): qty 10

## 2024-06-27 MED ORDER — TETANUS-DIPHTH-ACELL PERTUSSIS 5-2.5-18.5 LF-MCG/0.5 IM SUSY
0.5000 mL | PREFILLED_SYRINGE | Freq: Once | INTRAMUSCULAR | Status: AC
  Administered 2024-06-27: 0.5 mL via INTRAMUSCULAR
  Filled 2024-06-27: qty 0.5

## 2024-06-27 MED ORDER — IODIXANOL 320 MG/ML IV SOLN
INTRAVENOUS | Status: DC | PRN
  Administered 2024-06-27: 25 mL via INTRA_ARTERIAL

## 2024-06-27 MED ORDER — MIDAZOLAM-SODIUM CHLORIDE 100-0.9 MG/100ML-% IV SOLN
INTRAVENOUS | Status: AC
  Administered 2024-06-27: 2 mg/h
  Filled 2024-06-27: qty 100

## 2024-06-27 MED ORDER — METHYLPREDNISOLONE SODIUM SUCC 125 MG IJ SOLR
INTRAMUSCULAR | Status: AC
  Administered 2024-06-27: 125 mg via INTRAVENOUS
  Filled 2024-06-27: qty 2

## 2024-06-27 MED ORDER — KETAMINE HCL 50 MG/5ML IJ SOSY
PREFILLED_SYRINGE | INTRAMUSCULAR | Status: AC
  Filled 2024-06-27: qty 10

## 2024-06-27 MED ORDER — ETOMIDATE 2 MG/ML IV SOLN
INTRAVENOUS | Status: AC | PRN
  Administered 2024-06-27: 20 mg via INTRAVENOUS

## 2024-06-27 MED ORDER — DIPHENHYDRAMINE HCL 50 MG/ML IJ SOLN
INTRAMUSCULAR | Status: AC
  Administered 2024-06-27: 50 mg via INTRAVENOUS
  Filled 2024-06-27: qty 1

## 2024-06-27 MED ORDER — DEXTROSE 50 % IV SOLN
INTRAVENOUS | Status: AC
  Administered 2024-06-27: 50 mL via INTRAVENOUS
  Filled 2024-06-27: qty 50

## 2024-06-27 MED ORDER — ACETAMINOPHEN 160 MG/5ML PO SOLN
1000.0000 mg | Freq: Four times a day (QID) | ORAL | Status: DC
  Administered 2024-06-27 – 2024-06-28 (×5): 1000 mg
  Filled 2024-06-27 (×5): qty 40.6

## 2024-06-27 MED ORDER — HYDRALAZINE HCL 20 MG/ML IJ SOLN: 10.0000 mg | INTRAMUSCULAR | Status: DC | PRN

## 2024-06-27 MED ORDER — METHOCARBAMOL 1000 MG/10ML IJ SOLN
500.0000 mg | Freq: Three times a day (TID) | INTRAMUSCULAR | Status: AC
  Administered 2024-06-27 – 2024-06-28 (×4): 500 mg via INTRAVENOUS
  Filled 2024-06-27 (×4): qty 10

## 2024-06-27 MED ORDER — MIDAZOLAM HCL 2 MG/2ML IJ SOLN
INTRAMUSCULAR | Status: AC
  Filled 2024-06-27: qty 2

## 2024-06-27 MED ORDER — ROCURONIUM 10MG/ML (10ML) SYRINGE FOR MEDFUSION PUMP - OPTIME
INTRAVENOUS | Status: DC | PRN
  Administered 2024-06-27: 100 mg via INTRAVENOUS
  Administered 2024-06-27: 50 mg via INTRAVENOUS

## 2024-06-27 MED ORDER — PHENYLEPHRINE HCL-NACL 20-0.9 MG/250ML-% IV SOLN
INTRAVENOUS | Status: DC | PRN
  Administered 2024-06-27: 50 ug/min via INTRAVENOUS

## 2024-06-27 MED ORDER — ONDANSETRON 4 MG PO TBDP: 4.0000 mg | ORAL_TABLET | Freq: Four times a day (QID) | ORAL | Status: DC | PRN

## 2024-06-27 MED ORDER — CEFAZOLIN SODIUM-DEXTROSE 1-4 GM/50ML-% IV SOLN
1.0000 g | Freq: Once | INTRAVENOUS | Status: AC
  Administered 2024-06-27: 1 g via INTRAVENOUS
  Filled 2024-06-27: qty 50

## 2024-06-27 MED ORDER — FENTANYL CITRATE PF 50 MCG/ML IJ SOSY
PREFILLED_SYRINGE | INTRAMUSCULAR | Status: AC
  Filled 2024-06-27: qty 2

## 2024-06-27 MED ORDER — FENTANYL 2500MCG IN NS 250ML (10MCG/ML) PREMIX INFUSION
0.0000 ug/h | INTRAVENOUS | Status: DC
  Administered 2024-06-27: 300 ug/h via INTRAVENOUS
  Filled 2024-06-27: qty 250

## 2024-06-27 MED ORDER — PROPOFOL 1000 MG/100ML IV EMUL
0.0000 ug/kg/min | INTRAVENOUS | Status: DC
  Administered 2024-06-27 (×3): 45 ug/kg/min via INTRAVENOUS
  Administered 2024-06-27: 30 ug/kg/min via INTRAVENOUS
  Administered 2024-06-27: 35 ug/kg/min via INTRAVENOUS
  Administered 2024-06-28 (×2): 45 ug/kg/min via INTRAVENOUS
  Filled 2024-06-27 (×7): qty 100

## 2024-06-27 MED ORDER — PHENYLEPHRINE HCL (PRESSORS) 10 MG/ML IV SOLN
INTRAVENOUS | Status: DC | PRN
  Administered 2024-06-27: 160 ug via INTRAVENOUS

## 2024-06-27 MED ORDER — ONDANSETRON HCL 4 MG/2ML IJ SOLN: 4.0000 mg | Freq: Four times a day (QID) | INTRAMUSCULAR | Status: DC | PRN

## 2024-06-27 MED ORDER — SUCCINYLCHOLINE CHLORIDE 200 MG/10ML IV SOSY
PREFILLED_SYRINGE | INTRAVENOUS | Status: AC
  Filled 2024-06-27: qty 10

## 2024-06-27 MED ORDER — ROCURONIUM BROMIDE 10 MG/ML (PF) SYRINGE
PREFILLED_SYRINGE | INTRAVENOUS | Status: AC
  Filled 2024-06-27: qty 10

## 2024-06-27 MED ORDER — THIAMINE MONONITRATE 100 MG PO TABS
100.0000 mg | ORAL_TABLET | Freq: Every day | ORAL | Status: DC
  Administered 2024-06-27 – 2024-06-28 (×2): 100 mg
  Filled 2024-06-27 (×2): qty 1

## 2024-06-27 MED ORDER — 0.9 % SODIUM CHLORIDE (POUR BTL) OPTIME
TOPICAL | Status: DC | PRN
  Administered 2024-06-27: 1000 mL

## 2024-06-27 MED ORDER — FENTANYL BOLUS VIA INFUSION
25.0000 ug | INTRAVENOUS | Status: DC | PRN
  Administered 2024-06-27: 100 ug via INTRAVENOUS

## 2024-06-27 MED ORDER — LACTATED RINGERS IV SOLN: INTRAVENOUS | Status: AC

## 2024-06-27 MED ORDER — CHLORHEXIDINE GLUCONATE CLOTH 2 % EX PADS
6.0000 | MEDICATED_PAD | Freq: Every day | CUTANEOUS | Status: DC
Start: 2024-06-27 — End: 2024-07-02
  Administered 2024-06-27 – 2024-06-30 (×5): 6 via TOPICAL

## 2024-06-27 MED ORDER — DEXTROSE 50 % IV SOLN: 1.0000 | Freq: Once | INTRAVENOUS | Status: AC

## 2024-06-27 MED ORDER — CALCIUM GLUCONATE-NACL 2-0.675 GM/100ML-% IV SOLN
2.0000 g | Freq: Once | INTRAVENOUS | Status: AC
  Administered 2024-06-27: 2000 mg via INTRAVENOUS
  Filled 2024-06-27: qty 100

## 2024-06-27 MED ORDER — METHOCARBAMOL 500 MG PO TABS
500.0000 mg | ORAL_TABLET | Freq: Three times a day (TID) | ORAL | Status: AC
  Administered 2024-06-28 – 2024-06-29 (×4): 500 mg via ORAL
  Filled 2024-06-27 (×5): qty 1

## 2024-06-27 MED ORDER — INSULIN ASPART 100 UNIT/ML IV SOLN
10.0000 [IU] | Freq: Once | INTRAVENOUS | Status: AC
  Administered 2024-06-27: 10 [IU] via INTRAVENOUS

## 2024-06-27 MED ORDER — PROPOFOL 1000 MG/100ML IV EMUL
0.0000 ug/kg/min | INTRAVENOUS | Status: DC
  Administered 2024-06-27: 20 ug/kg/min via INTRAVENOUS
  Filled 2024-06-27: qty 100

## 2024-06-27 MED ORDER — SODIUM ZIRCONIUM CYCLOSILICATE 10 G PO PACK
10.0000 g | PACK | Freq: Once | ORAL | Status: AC
  Administered 2024-06-27: 10 g
  Filled 2024-06-27: qty 1

## 2024-06-27 MED ORDER — FENTANYL CITRATE PF 50 MCG/ML IJ SOSY: 25.0000 ug | PREFILLED_SYRINGE | Freq: Once | INTRAMUSCULAR | Status: DC

## 2024-06-27 MED ORDER — SODIUM BICARBONATE 8.4 % IV SOLN
50.0000 meq | Freq: Once | INTRAVENOUS | Status: AC
  Administered 2024-06-27: 50 meq via INTRAVENOUS

## 2024-06-27 MED ORDER — FENTANYL CITRATE (PF) 250 MCG/5ML IJ SOLN
INTRAMUSCULAR | Status: AC
  Filled 2024-06-27: qty 5

## 2024-06-27 MED ORDER — GABAPENTIN 250 MG/5ML PO SOLN
300.0000 mg | Freq: Three times a day (TID) | ORAL | Status: DC
  Administered 2024-06-27 – 2024-06-28 (×4): 300 mg
  Filled 2024-06-27 (×5): qty 6

## 2024-06-27 MED ORDER — SODIUM BICARBONATE 8.4 % IV SOLN
INTRAVENOUS | Status: AC
  Filled 2024-06-27: qty 50

## 2024-06-27 MED ORDER — ETOMIDATE 2 MG/ML IV SOLN
INTRAVENOUS | Status: AC
  Administered 2024-06-27: 20 mg via INTRAVENOUS
  Filled 2024-06-27: qty 20

## 2024-06-27 MED ADMIN — ORAL CARE MOUTH RINSE: 15 mL | OROMUCOSAL | NDC 99999080097

## 2024-06-27 MED ADMIN — Succinylcholine Chloride Inj 20 MG/ML: 150 mg | INTRAVENOUS | NDC 71288071911

## 2024-06-27 MED ADMIN — Iohexol IV Soln 350 MG/ML: 100 mL | INTRAVENOUS | NDC 00407141491

## 2024-06-27 MED ADMIN — Protamine Sulfate Inj 10 MG/ML: 25 mg | INTRAVENOUS | NDC 63323022905

## 2024-06-27 MED ADMIN — Cefazolin Sodium for IV Soln 2 GM and Dextrose 3% (50 ML): 2 g | INTRAVENOUS | NDC 00264310511

## 2024-06-27 MED ADMIN — Heparin Sodium (Porcine) Inj 1000 Unit/ML: 5000 [IU] | INTRAVENOUS | NDC 71288040211

## 2024-06-27 SURGICAL SUPPLY — 47 items
BAG COUNTER SPONGE SURGICOUNT (BAG) ×1 IMPLANT
BALLOON MUSTANG 5.0X40 75 (BALLOONS) IMPLANT
BALLOON STERLING RX 5X40X80 (BALLOONS) IMPLANT
CANISTER SUCTION 3000ML PPV (SUCTIONS) ×1 IMPLANT
CANISTER WOUNDNEG PRESSURE 500 (CANNISTER) IMPLANT
CATH CROSS OVER TEMPO 5F (CATHETERS) IMPLANT
CATH CXI SUPP 2.6F 150 ANG (CATHETERS) IMPLANT
CATH OMNI FLUSH 5F 65CM (CATHETERS) ×1 IMPLANT
CHLORAPREP W/TINT 26 (MISCELLANEOUS) ×1 IMPLANT
CONNECTOR Y WND VAC (MISCELLANEOUS) IMPLANT
COVER BACK TABLE 80X110 HD (DRAPES) ×2 IMPLANT
COVER PROBE W GEL 5X96 (DRAPES) ×1 IMPLANT
DERMABOND ADVANCED .7 DNX12 (GAUZE/BANDAGES/DRESSINGS) ×1 IMPLANT
DEVICE TORQUE KENDALL .025-038 (MISCELLANEOUS) IMPLANT
DRSG VAC GRANUFOAM LG (GAUZE/BANDAGES/DRESSINGS) IMPLANT
FILTER CO2 0.2 MICRON (VASCULAR PRODUCTS) IMPLANT
FILTER CO2 INSUFFLATOR AX1008 (MISCELLANEOUS) IMPLANT
GAUZE PAD ABD 8X10 STRL (GAUZE/BANDAGES/DRESSINGS) IMPLANT
GLOVE BIO SURGEON STRL SZ7.5 (GLOVE) ×1 IMPLANT
GOWN STRL REUS W/ TWL LRG LVL3 (GOWN DISPOSABLE) ×2 IMPLANT
GOWN STRL REUS W/ TWL XL LVL3 (GOWN DISPOSABLE) ×1 IMPLANT
GUIDEWIRE ANGLED .035X150CM (WIRE) IMPLANT
KIT ENCORE 26 ADVANTAGE (KITS) IMPLANT
KIT TURNOVER KIT B (KITS) ×1 IMPLANT
NDL PERC 18GX7CM (NEEDLE) ×1 IMPLANT
NEEDLE PERC 18GX7CM (NEEDLE) IMPLANT
NS IRRIG 1000ML POUR BTL (IV SOLUTION) IMPLANT
PACK ENDO MINOR (CUSTOM PROCEDURE TRAY) ×1 IMPLANT
PACK PERIPHERAL VASCULAR (CUSTOM PROCEDURE TRAY) IMPLANT
PAD ABD 8X10 STRL (GAUZE/BANDAGES/DRESSINGS) IMPLANT
PAD ARMBOARD POSITIONER FOAM (MISCELLANEOUS) ×2 IMPLANT
PAD NEG PRESSURE SENSATRAC (MISCELLANEOUS) IMPLANT
POWDER SURGICEL 3.0 GRAM (HEMOSTASIS) IMPLANT
SET FLUSH CO2 (MISCELLANEOUS) IMPLANT
SET MICROPUNCTURE 5F STIFF (MISCELLANEOUS) IMPLANT
SHEATH PINNACLE 5F 10CM (SHEATH) IMPLANT
SHEATH PINNACLE 6F 10CM (SHEATH) IMPLANT
STENT VIABAHN 6X50X120 (Permanent Stent) IMPLANT
STOPCOCK MORSE 400PSI 3WAY (MISCELLANEOUS) ×1 IMPLANT
SYR MEDRAD MARK V 150ML (SYRINGE) IMPLANT
TAPE CLOTH SURG 4X10 WHT LF (GAUZE/BANDAGES/DRESSINGS) IMPLANT
TOWEL GREEN STERILE (TOWEL DISPOSABLE) ×2 IMPLANT
TUBING HIGH PRESSURE 120CM (CONNECTOR) IMPLANT
TUBING INJECTOR 48 (MISCELLANEOUS) IMPLANT
WATER STERILE IRR 1000ML POUR (IV SOLUTION) IMPLANT
WIRE BENTSON .035X145CM (WIRE) ×1 IMPLANT
WIRE G V18X300 ST (WIRE) IMPLANT
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
## 2024-06-27 NOTE — ED Notes (Signed)
 To CT

## 2024-06-27 NOTE — Progress Notes (Signed)
 Pt transported from ED Tr A to OR without complications.

## 2024-06-27 NOTE — ED Notes (Signed)
 Tourniquet placed to R upper leg by Dr Ann

## 2024-06-27 NOTE — ED Notes (Signed)
 Verbal order By MD to give 50mg  of benadryl  and 125 solumedrol due to patient stating he has an allergy to contrast dye while in CT. MD also requested a Epi-pen.

## 2024-06-27 NOTE — ED Notes (Signed)
Tourniquet off by MD

## 2024-06-27 NOTE — ED Notes (Addendum)
 Tourniquet off and back on at 0107 by MD

## 2024-06-27 NOTE — ED Notes (Signed)
Tourniquet applied.

## 2024-06-27 NOTE — Progress Notes (Signed)
Changes made based on ABG results 

## 2024-06-27 NOTE — Progress Notes (Signed)
 OT Cancellation Note  Patient Details Name: Carlos Russell MRN: [id number] DOB: 1996-07-22   Cancelled Treatment:    Reason Eval/Treat Not Completed: Medical issues which prohibited therapy (intubated, sedated. Will follow up next date for OT as schedule permits)  Elmin Wiederholt K, OTD, OTR/L [facility] [phone]   [name] 06/27/2024, 12:23 PM

## 2024-06-27 NOTE — Progress Notes (Addendum)
  Trauma Event Note   1707: Verneita, bedside RN notified myself and Dr Sheree of medial calf wound vac oozing at site and putting out significant amount of sanguineous drainage into the canister.  300+ ml and counting.   *No change in VS. Dr Sheree states for RN to take the medial calf vac off for now and to pack it at the site.  Plans to fix tomorrow.   1715: TRN notified Dr Polly of this change. CBC ordered for now.    1745: TRN came to bedside to assess. Dr Polly notified at this time that blood soaking through gauze. Dr Polly came to bedside and sutured 4-0 prolene x2, quick clot applied, x2 kerlix gauze dressings packed in wound. Ace wrap applied externally. DP pulses palpable. Wound vac canister changed and working properly.  Last imported Vital Signs BP 106/69   Pulse 97   Temp 99.7 F (37.6 C)   Resp (!) 24   Ht 6' (1.829 m)   Wt 246 lb 11.1 oz (111.9 kg)   SpO2 99%   BMI 33.46 kg/m   Trending CBC Recent Labs    06/26/24 2346 06/26/24 2353 06/27/24 0815 06/27/24 0855 06/27/24 0856 06/27/24 0920  WBC 7.2  --   --  13.4*  --   --   HGB 11.7*   < > 12.2* 12.2*  --  11.6*  HCT 36.1*   < > 36.0* 36.4*  --  34.0*  PLT 227  --   --  193 207  --    < > = values in this interval not displayed.    Trending Coag's Recent Labs    06/26/24 2346 06/27/24 0856  APTT 25 25  INR 1.1 1.1    Trending BMET Recent Labs    06/26/24 2346 06/26/24 2353 06/27/24 0815 06/27/24 0855 06/27/24 0920  NA 144 145 139 138 139  K 3.3* 3.3* 6.5* 6.2* 6.1*  CL 110 109  --  108  --   CO2 20*  --   --  19*  --   BUN 8 8  --  8  --   CREATININE 1.59* 1.80*  --  1.44*  --   GLUCOSE 101* 94  --  138*  --     Laronn Devonshire W  Trauma Response RN  Please call TRN at [phone] for further assistance.
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
## 2024-06-27 NOTE — Progress Notes (Signed)
 PT Cancellation Note  Patient Details Name: Carlos Russell MRN: [id number] DOB: 03-01-1996   Cancelled Treatment:    Reason Eval/Treat Not Completed: Patient not medically ready - intubated, sedated.   Brazen Domangue S, PT DPT Acute Rehabilitation Services Secure Chat [facility]erred  Office [phone]    Carlos Russell 06/27/2024, 12:36 PM

## 2024-06-27 NOTE — Progress Notes (Signed)
 Orthopedic Tech Progress Note Patient Details:  Carlos Russell 10/07/1876 [id number]  Patient ID: [name], male   DOB: 10/07/1876, 28 y.o.   MRN: [id number] I attended trauma page. Chandra Dorn PARAS 06/27/2024, 12:18 AM

## 2024-06-27 NOTE — Transfer of Care (Signed)
 Immediate Anesthesia Transfer of Care Note  Patient: Carlos Russell  Procedure(s) Performed: Right lower extremity angiogram, right superficial femoral artery stent (Right) FASCIOTOMY, CALF (Right) EVACUATION HEMATOMA RIGHT THIGH (Right)  Patient Location: NICU  Anesthesia Type:General  Level of Consciousness: Patient remains intubated per anesthesia plan  Airway & Oxygen Therapy: Patient placed on Ventilator (see vital sign flow sheet for setting)  Post-op Assessment: Report given to RN and Post -op Vital signs reviewed and stable  Post vital signs: Reviewed and stable  Last Vitals:  Vitals Value Taken Time  BP 160/95   Temp    Pulse 101 06/27/24 04:50  Resp 18 06/27/24 04:50  SpO2 100 % 06/27/24 04:50  Vitals shown include unfiled device data.  Last Pain:  Vitals:   06/27/24 0000  TempSrc: Temporal         Complications: No notable events documented.

## 2024-06-27 NOTE — Progress Notes (Signed)
 Rtx2 transported pt from OR to 4N24 with Rnx2 on vent with no complications.

## 2024-06-27 NOTE — ED Notes (Signed)
 Surgeon at bedside.
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
## 2024-06-27 NOTE — ED Notes (Signed)
Back in room

## 2024-06-27 NOTE — ED Notes (Signed)
Tourniquet applied by MD.
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
## 2024-06-27 NOTE — ED Notes (Signed)
 Patient is actively moving. Unable to obtain scan due to patient moving. Patients arms have come out of the CT straps. Patient is moving both legs.

## 2024-06-27 NOTE — Progress Notes (Signed)
  Progress Note    06/27/2024 10:54 AM * Day of Surgery *  Subjective: Remains intubated and sedated  Vitals:   06/27/24 0900 06/27/24 1000  BP: 107/63 (!) 96/59  Pulse: (!) 103 (!) 101  Resp: 20 (!) 24  Temp: 98.2 F (36.8 C) 98.4 F (36.9 C)  SpO2: 100% 99%    Physical Exam: Intubated sedated Wound vacs right lower extremity to suction All compartments in thigh and lower leg are soft Palpable right posterior tibial pulse Left groin without hematoma  CBC    Component Value Date/Time   WBC 13.4 (H) 06/27/2024 0855   RBC 4.03 (L) 06/27/2024 0855   HGB 11.6 (L) 06/27/2024 0920   HCT 34.0 (L) 06/27/2024 0920   PLT 207 06/27/2024 0856   MCV 90.3 06/27/2024 0855   MCH 30.3 06/27/2024 0855   MCHC 33.5 06/27/2024 0855   RDW 14.3 06/27/2024 0855   LYMPHSABS 3.6 06/26/2024 2346   MONOABS 0.7 06/26/2024 2346   EOSABS 0.2 06/26/2024 2346   BASOSABS 0.0 06/26/2024 2346    BMET    Component Value Date/Time   NA 139 06/27/2024 0920   K 6.1 (H) 06/27/2024 0920   CL 108 06/27/2024 0855   CO2 19 (L) 06/27/2024 0855   GLUCOSE 138 (H) 06/27/2024 0855   BUN 8 06/27/2024 0855   CREATININE 1.44 (H) 06/27/2024 0855   CALCIUM  7.7 (L) 06/27/2024 0855   GFRNONAA >60 06/27/2024 0855    INR    Component Value Date/Time   INR 1.1 06/27/2024 0856     Intake/Output Summary (Last 24 hours) at 06/27/2024 1054 Last data filed at 06/27/2024 0900 Gross per 24 hour  Intake 1604.66 ml  Output 1350 ml  Net 254.66 ml     Assessment:  28 y.o. male is s/p:  1.  Percutaneous ultrasound-guided cannulation and Pro-glide device closure left common femoral artery 2.  Selection of right common femoral artery and right lower extremity angiography 3.  Stent right SFA with 6 mm x 5 cm Viabahn postdilated with 5 mm balloon 4.  Right lower extremity 4 compartment fasciotomies 5.  Washout right thigh hematoma 6.  Negative pressure dressing with wound VAC right medial thigh, right medial and  lateral leg  Plan: Aspirin  when okay per trauma  Plan for return to the operating room Tuesday 9/23 for wound washout, possible closure, wound VAC placement   Carlos Russell C. Sheree, MD [facility] Office: [phone] Pager: [phone]  06/27/2024 10:54 AM
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
## 2024-06-27 NOTE — Progress Notes (Signed)
 Pt transported from CT to ED Tr A without complications.
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
## 2024-06-28 ENCOUNTER — Encounter (HOSPITAL_COMMUNITY): Payer: Self-pay | Admitting: Vascular Surgery

## 2024-06-28 DIAGNOSIS — Z48812 Encounter for surgical aftercare following surgery on the circulatory system: Secondary | ICD-10-CM

## 2024-06-28 DIAGNOSIS — Z95820 Peripheral vascular angioplasty status with implants and grafts: Secondary | ICD-10-CM

## 2024-06-28 LAB — BPAM RBC
Blood Product Expiration Date: 202509272359
Blood Product Expiration Date: 202509272359
Blood Product Expiration Date: 202510192359
Blood Product Expiration Date: 202510192359
Blood Product Expiration Date: 202510192359
Blood Product Expiration Date: 202510192359
ISSUE DATE / TIME: 202509202347
ISSUE DATE / TIME: 202509202347
ISSUE DATE / TIME: 202509202347
ISSUE DATE / TIME: 202509202347
ISSUE DATE / TIME: 202509202348
ISSUE DATE / TIME: 202509202348
Unit Type and Rh: 5100
Unit Type and Rh: 5100
Unit Type and Rh: 5100
Unit Type and Rh: 5100
Unit Type and Rh: 5100
Unit Type and Rh: 5100

## 2024-06-28 LAB — TYPE AND SCREEN
ABO/RH(D): B POS
Antibody Screen: NEGATIVE
Unit division: 0
Unit division: 0
Unit division: 0
Unit division: 0
Unit division: 0
Unit division: 0

## 2024-06-28 LAB — BASIC METABOLIC PANEL WITH GFR
Anion gap: 7 (ref 5–15)
BUN: 9 mg/dL (ref 6–20)
CO2: 24 mmol/L (ref 22–32)
Calcium: 8.1 mg/dL — ABNORMAL LOW (ref 8.9–10.3)
Chloride: 108 mmol/L (ref 98–111)
Creatinine, Ser: 1.26 mg/dL — ABNORMAL HIGH (ref 0.61–1.24)
GFR, Estimated: >60 mL/min (ref >60)
Glucose, Bld: 125 mg/dL — ABNORMAL HIGH (ref 70–99)
Potassium: 3.4 mmol/L — ABNORMAL LOW (ref 3.5–5.1)
Sodium: 139 mmol/L (ref 135–145)

## 2024-06-28 LAB — CBC
HCT: 26.8 % — ABNORMAL LOW (ref 39.0–52.0)
Hemoglobin: 9.2 g/dL — ABNORMAL LOW (ref 13.0–17.0)
MCH: 31.2 pg (ref 26.0–34.0)
MCHC: 34.3 g/dL (ref 30.0–36.0)
MCV: 90.8 fL (ref 80.0–100.0)
Platelets: 128 K/uL — ABNORMAL LOW (ref 150–400)
RBC: 2.95 MIL/uL — ABNORMAL LOW (ref 4.22–5.81)
RDW: 14.3 % (ref 11.5–15.5)
WBC: 10.5 K/uL (ref 4.0–10.5)
nRBC: 0 % (ref 0.0–0.2)

## 2024-06-28 LAB — GLUCOSE, CAPILLARY
Glucose-Capillary: 109 mg/dL — ABNORMAL HIGH (ref 70–99)
Glucose-Capillary: 106 mg/dL — ABNORMAL HIGH (ref 70–99)
Glucose-Capillary: 108 mg/dL — ABNORMAL HIGH (ref 70–99)
Glucose-Capillary: 98 mg/dL (ref 70–99)
Glucose-Capillary: 137 mg/dL — ABNORMAL HIGH (ref 70–99)

## 2024-06-28 LAB — TRIGLYCERIDES: Triglycerides: 394 mg/dL — ABNORMAL HIGH (ref <150)

## 2024-06-28 LAB — MAGNESIUM: Magnesium: 1.8 mg/dL (ref 1.7–2.4)

## 2024-06-28 LAB — PHOSPHORUS: Phosphorus: 2.1 mg/dL — ABNORMAL LOW (ref 2.5–4.6)

## 2024-06-28 MED ORDER — FOLIC ACID 1 MG PO TABS
1.0000 mg | ORAL_TABLET | Freq: Every day | ORAL | Status: DC
  Administered 2024-06-28: 1 mg
  Filled 2024-06-28: qty 1

## 2024-06-28 MED ORDER — DOCUSATE SODIUM 50 MG/5ML PO LIQD: 100.0000 mg | Freq: Every day | ORAL | Status: DC

## 2024-06-28 MED ORDER — POTASSIUM & SODIUM PHOSPHATES 280-160-250 MG PO PACK
2.0000 | PACK | ORAL | Status: AC
  Administered 2024-06-28 (×2): 2 via ORAL
  Filled 2024-06-28 (×2): qty 2

## 2024-06-28 MED ORDER — MAGNESIUM SULFATE 2 GM/50ML IV SOLN
2.0000 g | Freq: Once | INTRAVENOUS | Status: AC
  Administered 2024-06-28: 2 g via INTRAVENOUS
  Filled 2024-06-28: qty 50

## 2024-06-28 MED ORDER — POLYETHYLENE GLYCOL 3350 17 G PO PACK
17.0000 g | PACK | Freq: Every day | ORAL | Status: DC
  Administered 2024-06-29 – 2024-06-30 (×2): 17 g via ORAL
  Filled 2024-06-28 (×2): qty 1

## 2024-06-28 MED ORDER — OXYCODONE HCL 5 MG PO TABS
5.0000 mg | ORAL_TABLET | ORAL | Status: DC | PRN
  Administered 2024-06-28 – 2024-06-30 (×9): 10 mg via ORAL
  Filled 2024-06-28 (×9): qty 2

## 2024-06-28 MED ORDER — DEXMEDETOMIDINE HCL IN NACL 400 MCG/100ML IV SOLN
0.0000 ug/kg/h | INTRAVENOUS | Status: AC
  Administered 2024-06-28: 0.4 ug/kg/h via INTRAVENOUS

## 2024-06-28 MED ORDER — DEXMEDETOMIDINE HCL IN NACL 400 MCG/100ML IV SOLN
INTRAVENOUS | Status: AC
  Filled 2024-06-28: qty 100

## 2024-06-28 MED ORDER — METOPROLOL TARTRATE 5 MG/5ML IV SOLN
5.0000 mg | Freq: Once | INTRAVENOUS | Status: AC
  Administered 2024-06-28: 5 mg via INTRAVENOUS
  Filled 2024-06-28: qty 5

## 2024-06-28 MED ORDER — ACETAMINOPHEN 500 MG PO TABS
1000.0000 mg | ORAL_TABLET | Freq: Four times a day (QID) | ORAL | Status: DC
  Administered 2024-06-28 – 2024-07-07 (×34): 1000 mg via ORAL
  Filled 2024-06-28 (×35): qty 2

## 2024-06-28 MED ORDER — GABAPENTIN 300 MG PO CAPS
300.0000 mg | ORAL_CAPSULE | Freq: Three times a day (TID) | ORAL | Status: DC
  Administered 2024-06-28 – 2024-06-30 (×6): 300 mg via ORAL
  Filled 2024-06-28 (×6): qty 1

## 2024-06-28 MED ORDER — OXYCODONE HCL 5 MG PO TABS
5.0000 mg | ORAL_TABLET | Freq: Four times a day (QID) | ORAL | Status: DC | PRN
  Administered 2024-06-28: 10 mg
  Filled 2024-06-28: qty 2

## 2024-06-28 MED ORDER — OXYCODONE HCL 5 MG PO TABS
5.0000 mg | ORAL_TABLET | Freq: Four times a day (QID) | ORAL | Status: DC | PRN
  Administered 2024-06-28: 10 mg via ORAL
  Filled 2024-06-28: qty 2

## 2024-06-28 MED ORDER — POTASSIUM & SODIUM PHOSPHATES 280-160-250 MG PO PACK
2.0000 | PACK | ORAL | Status: DC
  Administered 2024-06-28: 2
  Filled 2024-06-28: qty 2

## 2024-06-28 MED ORDER — GABAPENTIN 250 MG/5ML PO SOLN
300.0000 mg | Freq: Three times a day (TID) | ORAL | Status: DC
  Filled 2024-06-28 (×2): qty 6

## 2024-06-28 MED ORDER — DOCUSATE SODIUM 50 MG/5ML PO LIQD
100.0000 mg | Freq: Every day | ORAL | Status: DC
  Administered 2024-06-29 – 2024-07-01 (×3): 100 mg via ORAL
  Filled 2024-06-28 (×4): qty 10

## 2024-06-28 MED ORDER — THIAMINE MONONITRATE 100 MG PO TABS
100.0000 mg | ORAL_TABLET | Freq: Every day | ORAL | Status: DC
  Administered 2024-06-29 – 2024-07-07 (×9): 100 mg via ORAL
  Filled 2024-06-28 (×9): qty 1

## 2024-06-28 MED ORDER — FOLIC ACID 1 MG PO TABS
1.0000 mg | ORAL_TABLET | Freq: Every day | ORAL | Status: DC
  Administered 2024-06-29 – 2024-07-07 (×9): 1 mg via ORAL
  Filled 2024-06-28 (×9): qty 1

## 2024-06-28 MED ADMIN — ORAL CARE MOUTH RINSE: 15 mL | OROMUCOSAL | NDC 99999080097

## 2024-06-28 MED ADMIN — Enoxaparin Sodium Inj Soln Pref Syr 30 MG/0.3ML: 30 mg | SUBCUTANEOUS | NDC 71288043280

## 2024-06-28 MED FILL — Enoxaparin Sodium Inj Soln Pref Syr 30 MG/0.3ML: 30.0000 mg | INTRAMUSCULAR | Qty: 0.3 | Status: AC

## 2024-06-28 NOTE — Progress Notes (Signed)
 PT Cancellation Note  Patient Details Name: Carlos Russell MRN: [id number] DOB: 1996-02-03   Cancelled Treatment:    Reason Eval/Treat Not Completed: Patient not medically ready  Pt remains intubated and sedated. Noted plans for surgery Tues. Will follow-up for evaluation as appropriate.    Macario RAMAN, PT Acute Rehabilitation Services  Office [phone]   Macario SHAUNNA Soja 06/28/2024, 7:55 AM

## 2024-06-28 NOTE — Plan of Care (Signed)
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
## 2024-06-28 NOTE — TOC CM/SW Note (Signed)
 IVC filed  Envelope # [id number] Case # [id number] Expires 9/29

## 2024-06-28 NOTE — Progress Notes (Addendum)
  Progress Note    06/28/2024 8:11 AM 1 Day Post-Op  Subjective:  intubated, spontaneously opening his eyes    Vitals:   06/28/24 0600 06/28/24 0700  BP: 121/72 121/70  Pulse: 68 67  Resp: (!) 24 (!) 24  Temp: 97.9 F (36.6 C) 97.9 F (36.6 C)  SpO2: 100% 100%    Physical Exam: General:  spontaneously opening eyes Lungs:  intubated Extremities:  RLE bandaged and dry. Right calf with wound vac with good seal. Palpable right DP pulse  CBC    Component Value Date/Time   WBC 10.5 06/28/2024 0630   RBC 2.95 (L) 06/28/2024 0630   HGB 9.2 (L) 06/28/2024 0630   HCT 26.8 (L) 06/28/2024 0630   PLT 128 (L) 06/28/2024 0630   MCV 90.8 06/28/2024 0630   MCH 31.2 06/28/2024 0630   MCHC 34.3 06/28/2024 0630   RDW 14.3 06/28/2024 0630   LYMPHSABS 3.6 06/26/2024 2346   MONOABS 0.7 06/26/2024 2346   EOSABS 0.2 06/26/2024 2346   BASOSABS 0.0 06/26/2024 2346    BMET    Component Value Date/Time   NA 139 06/28/2024 0630   K 3.4 (L) 06/28/2024 0630   CL 108 06/28/2024 0630   CO2 24 06/28/2024 0630   GLUCOSE 125 (H) 06/28/2024 0630   BUN 9 06/28/2024 0630   CREATININE 1.26 (H) 06/28/2024 0630   CALCIUM  8.1 (L) 06/28/2024 0630   GFRNONAA >60 06/28/2024 0630    INR    Component Value Date/Time   INR 1.1 06/27/2024 0856     Intake/Output Summary (Last 24 hours) at 06/28/2024 0811 Last data filed at 06/28/2024 0700 Gross per 24 hour  Intake 4256.32 ml  Output 1575 ml  Net 2681.32 ml      Assessment/Plan:  28 y.o. male is 1 day post op, s/p:  1.  Percutaneous ultrasound-guided cannulation and Pro-glide device closure left common femoral artery 2.  Selection of right common femoral artery and right lower extremity angiography 3.  Stent right SFA with 6 mm x 5 cm Viabahn postdilated with 5 mm balloon 4.  Right lower extremity 4 compartment fasciotomies 5.  [place] right thigh hematoma 6.  Negative pressure dressing with wound VAC right medial thigh, right medial and  lateral leg   -He is spontaneously opening his eyes this morning. He remains intubated however there are possible plans for extubation later today -Left groin access site c/d/l without hematoma -Right calf with wound vac with good seal -RLE compartments are soft -RLE well perfused with palpable DP pulse -Will plan return to OR tomorrow for fasciotomy washout with possible closure vs wound vac replacement  Ahmed Holster, PA-C [facility] [phone] 06/28/2024 8:11 AM   I have independently interviewed and examined patient and agree with PA assessment and plan above.  Leave dressings today and will change tomorrow possibly can partially close incisions tomorrow and will obtain hemostasis in replace wound vacs.  His right foot is well-perfused though I expect some neurologic deficit when he is fully awake to evaluate.  Erhard Senske C. Sheree, MD [facility] [place] Office: [phone] Pager: [phone]

## 2024-06-28 NOTE — Progress Notes (Signed)
 OT Cancellation Note  Patient Details Name: Carlos Russell MRN: [id number] DOB: Mar 24, 1996   Cancelled Treatment:    Reason Eval/Treat Not Completed: Medical issues which prohibited therapy.  Possible extubation, OT will follow along for appropriateness.    [name] 06/28/2024, 7:55 AM 06/28/2024  RP, OTR/L  Acute Rehabilitation Services  Office:  [phone]

## 2024-06-28 NOTE — Procedures (Signed)
 Extubation Procedure Note  Patient Details:   Name: Carlos Russell DOB: [birth year] MRN: [id number]   Airway Documentation:    Vent end date: 06/28/24 Vent end time: 1003   Evaluation  O2 sats: stable throughout Complications: No apparent complications Patient did tolerate procedure well. Bilateral Breath Sounds: Diminished   Yes Pt extubated to 2L [place] per MD. Positive cuff leak noted.  Leontine Murtis GAILS 06/28/2024, 10:03 AM
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
## 2024-06-29 ENCOUNTER — Encounter (HOSPITAL_COMMUNITY): Payer: Self-pay

## 2024-06-29 ENCOUNTER — Other Ambulatory Visit: Payer: Self-pay

## 2024-06-29 ENCOUNTER — Inpatient Hospital Stay (HOSPITAL_COMMUNITY): Payer: Self-pay

## 2024-06-29 DIAGNOSIS — Z481 Encounter for planned postprocedural wound closure: Secondary | ICD-10-CM | POA: Diagnosis not present

## 2024-06-29 DIAGNOSIS — S81831A Puncture wound without foreign body, right lower leg, initial encounter: Secondary | ICD-10-CM

## 2024-06-29 DIAGNOSIS — T79A21A Traumatic compartment syndrome of right lower extremity, initial encounter: Secondary | ICD-10-CM | POA: Diagnosis not present

## 2024-06-29 DIAGNOSIS — Z95820 Peripheral vascular angioplasty status with implants and grafts: Secondary | ICD-10-CM | POA: Diagnosis not present

## 2024-06-29 DIAGNOSIS — S7011XA Contusion of right thigh, initial encounter: Secondary | ICD-10-CM | POA: Diagnosis not present

## 2024-06-29 HISTORY — PX: APPLICATION OF WOUND VAC: SHX5189

## 2024-06-29 HISTORY — PX: INCISION AND DRAINAGE OF WOUND: SHX1803

## 2024-06-29 LAB — BASIC METABOLIC PANEL WITH GFR
Anion gap: 9 (ref 5–15)
BUN: 7 mg/dL (ref 6–20)
CO2: 27 mmol/L (ref 22–32)
Calcium: 8 mg/dL — ABNORMAL LOW (ref 8.9–10.3)
Chloride: 104 mmol/L (ref 98–111)
Creatinine, Ser: 1.12 mg/dL (ref 0.61–1.24)
GFR, Estimated: >60 mL/min (ref >60)
Glucose, Bld: 93 mg/dL (ref 70–99)
Potassium: 3.9 mmol/L (ref 3.5–5.1)
Sodium: 140 mmol/L (ref 135–145)

## 2024-06-29 LAB — CBC
HCT: 24.1 % — ABNORMAL LOW (ref 39.0–52.0)
Hemoglobin: 8 g/dL — ABNORMAL LOW (ref 13.0–17.0)
MCH: 30.7 pg (ref 26.0–34.0)
MCHC: 33.2 g/dL (ref 30.0–36.0)
MCV: 92.3 fL (ref 80.0–100.0)
Platelets: 113 K/uL — ABNORMAL LOW (ref 150–400)
RBC: 2.61 MIL/uL — ABNORMAL LOW (ref 4.22–5.81)
RDW: 13.9 % (ref 11.5–15.5)
WBC: 11 K/uL — ABNORMAL HIGH (ref 4.0–10.5)
nRBC: 0 % (ref 0.0–0.2)

## 2024-06-29 LAB — GLUCOSE, CAPILLARY
Glucose-Capillary: 90 mg/dL (ref 70–99)
Glucose-Capillary: 90 mg/dL (ref 70–99)
Glucose-Capillary: 101 mg/dL — ABNORMAL HIGH (ref 70–99)
Glucose-Capillary: 129 mg/dL — ABNORMAL HIGH (ref 70–99)
Glucose-Capillary: 113 mg/dL — ABNORMAL HIGH (ref 70–99)

## 2024-06-29 LAB — PHOSPHORUS: Phosphorus: 3.9 mg/dL (ref 2.5–4.6)

## 2024-06-29 LAB — MAGNESIUM: Magnesium: 2.1 mg/dL (ref 1.7–2.4)

## 2024-06-29 SURGERY — IRRIGATION AND DEBRIDEMENT WOUND
Anesthesia: General | Site: Leg Upper | Laterality: Right

## 2024-06-29 MED ORDER — FENTANYL CITRATE (PF) 100 MCG/2ML IJ SOLN
25.0000 ug | INTRAMUSCULAR | Status: DC | PRN
  Administered 2024-06-29 (×3): 50 ug via INTRAVENOUS

## 2024-06-29 MED ORDER — HYDROMORPHONE HCL 1 MG/ML IJ SOLN
INTRAMUSCULAR | Status: AC
  Filled 2024-06-29: qty 0.5

## 2024-06-29 MED ORDER — 0.9 % SODIUM CHLORIDE (POUR BTL) OPTIME
TOPICAL | Status: DC | PRN
  Administered 2024-06-29: 1000 mL

## 2024-06-29 MED ORDER — HYDROMORPHONE HCL 1 MG/ML IJ SOLN
1.0000 mg | INTRAMUSCULAR | Status: DC | PRN
Start: 2024-06-29 — End: 2024-06-30
  Administered 2024-06-29 – 2024-06-30 (×3): 1 mg via INTRAVENOUS
  Filled 2024-06-29 (×3): qty 1

## 2024-06-29 MED ORDER — FENTANYL CITRATE (PF) 250 MCG/5ML IJ SOLN
INTRAMUSCULAR | Status: AC
  Filled 2024-06-29: qty 5

## 2024-06-29 MED ORDER — HYDROMORPHONE HCL 1 MG/ML IJ SOLN
INTRAMUSCULAR | Status: AC
  Filled 2024-06-29: qty 1

## 2024-06-29 MED ORDER — MIDAZOLAM HCL 2 MG/2ML IJ SOLN
INTRAMUSCULAR | Status: AC
  Filled 2024-06-29: qty 2

## 2024-06-29 MED ORDER — DROPERIDOL 2.5 MG/ML IJ SOLN: 0.6250 mg | Freq: Once | INTRAMUSCULAR | Status: DC | PRN

## 2024-06-29 MED ORDER — OXYCODONE HCL 5 MG PO TABS: 5.0000 mg | ORAL_TABLET | Freq: Once | ORAL | Status: DC | PRN

## 2024-06-29 MED ORDER — HYDROMORPHONE HCL 1 MG/ML IJ SOLN
INTRAMUSCULAR | Status: DC | PRN
  Administered 2024-06-29 (×3): .5 mg via INTRAVENOUS

## 2024-06-29 MED ORDER — FENTANYL CITRATE (PF) 250 MCG/5ML IJ SOLN
INTRAMUSCULAR | Status: DC | PRN
  Administered 2024-06-29: 50 ug via INTRAVENOUS

## 2024-06-29 MED ORDER — DEXMEDETOMIDINE HCL IN NACL 80 MCG/20ML IV SOLN
INTRAVENOUS | Status: DC | PRN
  Administered 2024-06-29 (×2): 8 ug via INTRAVENOUS

## 2024-06-29 MED ORDER — ORAL CARE MOUTH RINSE: 15.0000 mL | Freq: Once | OROMUCOSAL | Status: AC

## 2024-06-29 MED ORDER — PROPOFOL 10 MG/ML IV BOLUS
INTRAVENOUS | Status: DC | PRN
  Administered 2024-06-29: 20 mg via INTRAVENOUS
  Administered 2024-06-29: 200 mg via INTRAVENOUS

## 2024-06-29 MED ORDER — PHENYLEPHRINE 80 MCG/ML (10ML) SYRINGE FOR IV PUSH (FOR BLOOD PRESSURE SUPPORT)
PREFILLED_SYRINGE | INTRAVENOUS | Status: DC | PRN
  Administered 2024-06-29 (×6): 80 ug via INTRAVENOUS

## 2024-06-29 MED ORDER — FENTANYL CITRATE (PF) 100 MCG/2ML IJ SOLN
INTRAMUSCULAR | Status: AC
  Filled 2024-06-29: qty 2

## 2024-06-29 MED ORDER — VASHE WOUND IRRIGATION OPTIME
TOPICAL | Status: DC | PRN
  Administered 2024-06-29: 34 [oz_av]

## 2024-06-29 MED ORDER — OXYCODONE HCL 5 MG/5ML PO SOLN: 5.0000 mg | Freq: Once | ORAL | Status: DC | PRN

## 2024-06-29 MED ORDER — CEFAZOLIN SODIUM-DEXTROSE 2-3 GM-%(50ML) IV SOLR
INTRAVENOUS | Status: DC | PRN
  Administered 2024-06-29: 2 g via INTRAVENOUS

## 2024-06-29 MED ORDER — MIDAZOLAM HCL 2 MG/2ML IJ SOLN
INTRAMUSCULAR | Status: DC | PRN
  Administered 2024-06-29: 2 mg via INTRAVENOUS

## 2024-06-29 MED ORDER — ACETAMINOPHEN 10 MG/ML IV SOLN: 1000.0000 mg | Freq: Once | INTRAVENOUS | Status: DC | PRN

## 2024-06-29 MED ORDER — DEXAMETHASONE SODIUM PHOSPHATE 10 MG/ML IJ SOLN
INTRAMUSCULAR | Status: DC | PRN
  Administered 2024-06-29 (×2): 10 mg via INTRAVENOUS

## 2024-06-29 MED ORDER — PROPRANOLOL HCL 20 MG PO TABS: 20.0000 mg | ORAL_TABLET | Freq: Three times a day (TID) | ORAL | Status: DC

## 2024-06-29 MED ORDER — CHLORHEXIDINE GLUCONATE 0.12 % MT SOLN
15.0000 mL | Freq: Once | OROMUCOSAL | Status: AC
  Administered 2024-06-29: 15 mL via OROMUCOSAL
  Filled 2024-06-29: qty 15

## 2024-06-29 MED ORDER — LIDOCAINE 2% (20 MG/ML) 5 ML SYRINGE
INTRAMUSCULAR | Status: DC | PRN
  Administered 2024-06-29: 100 mg via INTRAVENOUS

## 2024-06-29 MED ADMIN — Ondansetron HCl Inj 4 MG/2ML (2 MG/ML): 4 mg | INTRAVENOUS | NDC 60505613005

## 2024-06-29 MED FILL — Propranolol HCl Tab 20 MG: 20.0000 mg | ORAL | Qty: 1 | Status: AC

## 2024-06-29 MED FILL — Enoxaparin Sodium Inj Soln Pref Syr 30 MG/0.3ML: 30.0000 mg | INTRAMUSCULAR | Qty: 0.3 | Status: AC

## 2024-06-29 MED FILL — Ketamine HCl Soln Pref Syr 50 MG/5ML (10 MG/ML): INTRAMUSCULAR | Qty: 5 | Status: CN

## 2024-06-29 MED FILL — Propofol IV Emul 200 MG/20ML (10 MG/ML): INTRAVENOUS | Qty: 20 | Status: AC

## 2024-06-29 SURGICAL SUPPLY — 40 items
BAG COUNTER SPONGE SURGICOUNT (BAG) ×2 IMPLANT
BAG ISOLATION DRAPE 18X18 (DRAPES) IMPLANT
BNDG ELASTIC 4X5.8 VLCR STR LF (GAUZE/BANDAGES/DRESSINGS) IMPLANT
BNDG ELASTIC 6INX 5YD STR LF (GAUZE/BANDAGES/DRESSINGS) IMPLANT
BNDG GAUZE DERMACEA FLUFF 4 (GAUZE/BANDAGES/DRESSINGS) IMPLANT
CANISTER SUCTION 3000ML PPV (SUCTIONS) ×2 IMPLANT
CLIP LIGATING EXTRA MED SLVR (CLIP) ×2 IMPLANT
CLIP LIGATING EXTRA SM BLUE (MISCELLANEOUS) ×2 IMPLANT
COVER SURGICAL LIGHT HANDLE (MISCELLANEOUS) ×2 IMPLANT
DRAPE DERMATAC (DRAPES) IMPLANT
DRAPE HALF SHEET 40X57 (DRAPES) IMPLANT
DRAPE U-SHAPE 76X120 STRL (DRAPES) IMPLANT
DRSG COVADERM 4X10 (GAUZE/BANDAGES/DRESSINGS) IMPLANT
DRSG VAC GRANUFOAM MED (GAUZE/BANDAGES/DRESSINGS) IMPLANT
ELECTRODE REM PT RTRN 9FT ADLT (ELECTROSURGICAL) ×2 IMPLANT
GAUZE SPONGE 4X4 12PLY STRL (GAUZE/BANDAGES/DRESSINGS) ×2 IMPLANT
GAUZE XEROFORM 5X9 LF (GAUZE/BANDAGES/DRESSINGS) IMPLANT
GLOVE BIO SURGEON STRL SZ7.5 (GLOVE) ×2 IMPLANT
GOWN STRL REUS W/ TWL LRG LVL3 (GOWN DISPOSABLE) ×4 IMPLANT
GOWN STRL REUS W/ TWL XL LVL3 (GOWN DISPOSABLE) ×2 IMPLANT
IV NS IRRIG 3000ML ARTHROMATIC (IV SOLUTION) ×2 IMPLANT
KIT BASIN OR (CUSTOM PROCEDURE TRAY) ×2 IMPLANT
KIT TURNOVER KIT B (KITS) ×2 IMPLANT
NS IRRIG 1000ML POUR BTL (IV SOLUTION) ×2 IMPLANT
PACK CV ACCESS (CUSTOM PROCEDURE TRAY) IMPLANT
PACK GENERAL/GYN (CUSTOM PROCEDURE TRAY) ×2 IMPLANT
PACK UNIVERSAL I (CUSTOM PROCEDURE TRAY) ×2 IMPLANT
PAD ARMBOARD POSITIONER FOAM (MISCELLANEOUS) ×4 IMPLANT
POWDER SURGICEL 3.0 GRAM (HEMOSTASIS) IMPLANT
SET HNDPC FAN SPRY TIP SCT (DISPOSABLE) IMPLANT
SPONGE T-LAP 18X18 ~~LOC~~+RFID (SPONGE) IMPLANT
STAPLER SKIN PROX 35W (STAPLE) IMPLANT
SUT ETHILON 3 0 PS 1 (SUTURE) IMPLANT
SUT VIC AB 2-0 CTX 36 (SUTURE) IMPLANT
SUT VIC AB 3-0 SH 27X BRD (SUTURE) IMPLANT
SUT VIC AB 4-0 PS2 18 (SUTURE) IMPLANT
SYR BULB IRRIG 60ML STRL (SYRINGE) IMPLANT
TIP FAN IRRIG PULSAVAC PLUS (DISPOSABLE) IMPLANT
TOWEL GREEN STERILE (TOWEL DISPOSABLE) ×2 IMPLANT
WATER STERILE IRR 1000ML POUR (IV SOLUTION) ×2 IMPLANT
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
## 2024-06-29 NOTE — Anesthesia Postprocedure Evaluation (Signed)
 Anesthesia Post Note  Patient: Carlos Russell  Procedure(s) Performed: Right lower extremity angiogram, right superficial femoral artery stent (Right) FASCIOTOMY, CALF (Right) EVACUATION HEMATOMA RIGHT THIGH (Right)     Patient location during evaluation: SICU Anesthesia Type: General Level of consciousness: sedated Pain management: pain level controlled Vital Signs Assessment: post-procedure vital signs reviewed and stable Respiratory status: patient remains intubated per anesthesia plan Cardiovascular status: stable Postop Assessment: no apparent nausea or vomiting Anesthetic complications: no   No notable events documented.  Last Vitals:  Vitals:   06/29/24 1115 06/29/24 1148  BP: (!) 146/90 (!) 145/94  Pulse: 96 (!) 110  Resp: 15 16  Temp: 36.9 C 37.4 C  SpO2: 99% 98%    Last Pain:  Vitals:   06/29/24 1156  TempSrc:   PainSc: 8                  Cristian Grieves P Arayla Kruschke

## 2024-06-29 NOTE — Progress Notes (Signed)
 PT Cancellation Note  Patient Details Name: Carlos Russell MRN: [id number] DOB: 1996-07-21   Cancelled Treatment:    Reason Eval/Treat Not Completed: Pain limiting ability to participate  Despite pain medication ~30 minutes prior, pt reporting pain 10/10 (was only 5/10 9/22 with activity). Will attempt to return at a later time. Noted surgery ~12:00.    Macario RAMAN, PT Acute Rehabilitation Services  Office [phone]   Macario SHAUNNA Soja 06/29/2024, 9:44 AM
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
## 2024-06-29 NOTE — Plan of Care (Signed)
  Problem: Health Behavior/Discharge Planning: Goal: Ability to manage health-related needs will improve Outcome: Progressing   Problem: Clinical Measurements: Goal: Diagnostic test results will improve Outcome: Progressing   Problem: Safety: Goal: Ability to remain free from injury will improve Outcome: Progressing   

## 2024-06-29 NOTE — Progress Notes (Signed)
  Progress Note    06/29/2024 12:36 PM * Day of Surgery *  Subjective:  no overnight issues  Vitals:   06/29/24 1115 06/29/24 1148  BP: (!) 146/90 (!) 145/94  Pulse: 96 (!) 110  Resp: 15 16  Temp: 98.4 F (36.9 C) 99.3 F (37.4 C)  SpO2: 99% 98%    Physical Exam: Aaox3 Wounds dressed RLE, foot warm and well perfused  CBC    Component Value Date/Time   WBC 11.0 (H) 06/29/2024 0538   RBC 2.61 (L) 06/29/2024 0538   HGB 8.0 (L) 06/29/2024 0538   HCT 24.1 (L) 06/29/2024 0538   PLT 113 (L) 06/29/2024 0538   MCV 92.3 06/29/2024 0538   MCH 30.7 06/29/2024 0538   MCHC 33.2 06/29/2024 0538   RDW 13.9 06/29/2024 0538   LYMPHSABS 3.6 06/26/2024 2346   MONOABS 0.7 06/26/2024 2346   EOSABS 0.2 06/26/2024 2346   BASOSABS 0.0 06/26/2024 2346    BMET    Component Value Date/Time   NA 140 06/29/2024 0538   K 3.9 06/29/2024 0538   CL 104 06/29/2024 0538   CO2 27 06/29/2024 0538   GLUCOSE 93 06/29/2024 0538   BUN 7 06/29/2024 0538   CREATININE 1.12 06/29/2024 0538   CALCIUM  8.0 (L) 06/29/2024 0538   GFRNONAA >60 06/29/2024 0538    INR    Component Value Date/Time   INR 1.1 06/27/2024 0856     Intake/Output Summary (Last 24 hours) at 06/29/2024 1236 Last data filed at 06/29/2024 1021 Gross per 24 hour  Intake 634.22 ml  Output 2475 ml  Net -1840.78 ml     Assessment:  28 y.o. male is s/p: 1.  Percutaneous ultrasound-guided cannulation and Pro-glide device closure left common femoral artery 2.  Selection of right common femoral artery and right lower extremity angiography 3.  Stent right SFA with 6 mm x 5 cm Viabahn postdilated with 5 mm balloon 4.  Right lower extremity 4 compartment fasciotomies 5.  Washout right thigh hematoma 6.  Negative pressure dressing with wound VAC right medial thigh, right medial and lateral leg  Plan: OR today for washout and replacement of wound vacs and possible closure of wounds   Jarek Longton C. Sheree, MD [facility] [place] Office: [phone] Pager: [phone]  06/29/2024 12:36 PM

## 2024-06-29 NOTE — Progress Notes (Signed)
 OT Cancellation Note  Patient Details Name: Juma Oxley MRN: [id number] DOB: 03-21-1996   Cancelled Treatment:    Reason Eval/Treat Not Completed: Patient at procedure or test/ unavailable.  Scheduled for additional I&D.  Continue efforts.    Dorathy Stallone D Halsey Persaud 06/29/2024, 9:39 AM 06/29/2024  RP, OTR/L  Acute Rehabilitation Services  Office:  [phone]

## 2024-06-29 NOTE — Progress Notes (Signed)
 MD notified of trending HR of 120s-125s. Patient lying in bed, asymptomatic eating dinner. MD order to continue to monitor for symptoms.

## 2024-06-29 NOTE — Progress Notes (Signed)
   06/28/24 2245  Vitals  BP 133/66  MAP (mmHg) 83  BP Location Right Arm  BP Method Automatic  Patient Position (if appropriate) Lying  Pulse Rate (!) 136  Pulse Rate Source Monitor  ECG Heart Rate (!) 137  Resp 13  MEWS COLOR  MEWS Score Color Red  Oxygen Therapy  SpO2 96 %  O2 Device Nasal Cannula  O2 Flow Rate (L/min) 2 L/min  MEWS Score  MEWS Temp 0  MEWS Systolic 0  MEWS Pulse 3  MEWS RR 1  MEWS LOC 0  MEWS Score 4    Patient has a sustaining HR between 135-140. Patient is currently in no pain and resting in bed with last medication given at 2025.   This RN paged Sebastian MD. Received 1 time order for Lopressor .

## 2024-06-29 NOTE — Anesthesia Procedure Notes (Signed)
 Procedure Name: LMA Insertion Date/Time: 06/29/2024 1:37 PM  Performed by: Mollie Olivia SAUNDERS, CRNAPre-anesthesia Checklist: Patient identified, Emergency Drugs available, Suction available and Patient being monitored Patient Re-evaluated:Patient Re-evaluated prior to induction Oxygen Delivery Method: Circle system utilized Preoxygenation: Pre-oxygenation with 100% oxygen Induction Type: IV induction Ventilation: Mask ventilation without difficulty LMA: LMA inserted LMA Size: 4.0 Tube type: Oral Number of attempts: 1 Placement Confirmation: ETT inserted through vocal cords under direct vision, positive ETCO2 and breath sounds checked- equal and bilateral Tube secured with: Tape Dental Injury: Teeth and Oropharynx as per pre-operative assessment
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
## 2024-06-29 NOTE — Anesthesia Postprocedure Evaluation (Signed)
 Anesthesia Post Note  Patient: Conrad Zajkowski Hoyo Ramero  Procedure(s) Performed: RIGHT LEG WOUND IRRIGATION AND DEBRIDEMENT WOUND CLOSURE (Right: Leg Upper) APPLICATION, WOUND VAC     Patient location during evaluation: PACU Anesthesia Type: General Level of consciousness: awake and alert Pain management: pain level controlled Vital Signs Assessment: post-procedure vital signs reviewed and stable Respiratory status: spontaneous breathing, nonlabored ventilation, respiratory function stable and patient connected to nasal cannula oxygen Cardiovascular status: blood pressure returned to baseline and stable Postop Assessment: no apparent nausea or vomiting Anesthetic complications: no   No notable events documented.  Last Vitals:  Vitals:   06/29/24 1543 06/29/24 1936  BP: 139/83 134/79  Pulse: (!) 105 (!) 114  Resp: 12 16  Temp: 36.9 C 37.1 C  SpO2: 97% 99%    Last Pain:  Vitals:   06/29/24 1943  TempSrc:   PainSc: 9                  Thom JONELLE Peoples

## 2024-06-29 NOTE — Transfer of Care (Signed)
 Immediate Anesthesia Transfer of Care Note  Patient: Carlos Russell  Procedure(s) Performed: RIGHT LEG WOUND IRRIGATION AND DEBRIDEMENT WOUND CLOSURE (Right: Leg Upper) APPLICATION, WOUND VAC  Patient Location: PACU  Anesthesia Type:General  Level of Consciousness: awake, alert , and drowsy  Airway & Oxygen Therapy: Patient Spontanous Breathing and Patient connected to face mask oxygen  Post-op Assessment: Report given to RN, Post -op Vital signs reviewed and stable, and Patient moving all extremities X 4  Post vital signs: Reviewed and stable  Last Vitals:  Vitals Value Taken Time  BP 149/90 06/29/24 14:45  Temp    Pulse 106 06/29/24 14:45  Resp 14 06/29/24 14:45  SpO2 87 % 06/29/24 14:45  Vitals shown include unfiled device data.  Last Pain:  Vitals:   06/29/24 1156  TempSrc:   PainSc: 8          Complications: No notable events documented.

## 2024-06-30 ENCOUNTER — Encounter (HOSPITAL_COMMUNITY): Payer: Self-pay | Admitting: Vascular Surgery

## 2024-06-30 DIAGNOSIS — S069XAA Unspecified intracranial injury with loss of consciousness status unknown, initial encounter: Secondary | ICD-10-CM | POA: Diagnosis present

## 2024-06-30 LAB — CBC
HCT: 24.2 % — ABNORMAL LOW (ref 39.0–52.0)
Hemoglobin: 8 g/dL — ABNORMAL LOW (ref 13.0–17.0)
MCH: 30.5 pg (ref 26.0–34.0)
MCHC: 33.1 g/dL (ref 30.0–36.0)
MCV: 92.4 fL (ref 80.0–100.0)
Platelets: 149 K/uL — ABNORMAL LOW (ref 150–400)
RBC: 2.62 MIL/uL — ABNORMAL LOW (ref 4.22–5.81)
RDW: 13.4 % (ref 11.5–15.5)
WBC: 10.3 K/uL (ref 4.0–10.5)
nRBC: 0 % (ref 0.0–0.2)

## 2024-06-30 LAB — GLUCOSE, CAPILLARY
Glucose-Capillary: 116 mg/dL — ABNORMAL HIGH (ref 70–99)
Glucose-Capillary: 110 mg/dL — ABNORMAL HIGH (ref 70–99)
Glucose-Capillary: 156 mg/dL — ABNORMAL HIGH (ref 70–99)
Glucose-Capillary: 103 mg/dL — ABNORMAL HIGH (ref 70–99)
Glucose-Capillary: 136 mg/dL — ABNORMAL HIGH (ref 70–99)
Glucose-Capillary: 103 mg/dL — ABNORMAL HIGH (ref 70–99)

## 2024-06-30 LAB — BASIC METABOLIC PANEL WITH GFR
Anion gap: 7 (ref 5–15)
BUN: 5 mg/dL — ABNORMAL LOW (ref 6–20)
CO2: 28 mmol/L (ref 22–32)
Calcium: 8.4 mg/dL — ABNORMAL LOW (ref 8.9–10.3)
Chloride: 103 mmol/L (ref 98–111)
Creatinine, Ser: 1.53 mg/dL — ABNORMAL HIGH (ref 0.61–1.24)
GFR, Estimated: >60 mL/min (ref >60)
Glucose, Bld: 113 mg/dL — ABNORMAL HIGH (ref 70–99)
Potassium: 4.3 mmol/L (ref 3.5–5.1)
Sodium: 138 mmol/L (ref 135–145)

## 2024-06-30 LAB — PHOSPHORUS: Phosphorus: 4.1 mg/dL (ref 2.5–4.6)

## 2024-06-30 LAB — MAGNESIUM: Magnesium: 2.1 mg/dL (ref 1.7–2.4)

## 2024-06-30 MED ORDER — POLYETHYLENE GLYCOL 3350 17 G PO PACK
17.0000 g | PACK | Freq: Two times a day (BID) | ORAL | Status: DC
  Administered 2024-06-30 – 2024-07-06 (×8): 17 g via ORAL
  Filled 2024-06-30 (×10): qty 1

## 2024-06-30 MED ORDER — GABAPENTIN 400 MG PO CAPS
400.0000 mg | ORAL_CAPSULE | Freq: Three times a day (TID) | ORAL | Status: DC
  Administered 2024-06-30 – 2024-07-04 (×14): 400 mg via ORAL
  Filled 2024-06-30 (×14): qty 1

## 2024-06-30 MED ORDER — SODIUM CHLORIDE 0.9 % IV BOLUS
1000.0000 mL | Freq: Once | INTRAVENOUS | Status: AC
  Administered 2024-06-30: 1000 mL via INTRAVENOUS

## 2024-06-30 MED ORDER — OXYCODONE HCL 5 MG PO TABS
10.0000 mg | ORAL_TABLET | ORAL | Status: DC | PRN
  Administered 2024-06-30 – 2024-07-06 (×22): 15 mg via ORAL
  Administered 2024-07-06: 10 mg via ORAL
  Administered 2024-07-06 – 2024-07-07 (×4): 15 mg via ORAL
  Filled 2024-06-30 (×2): qty 3
  Filled 2024-06-30: qty 2
  Filled 2024-06-30 (×24): qty 3

## 2024-06-30 MED ORDER — HYDROMORPHONE HCL 1 MG/ML IJ SOLN
0.5000 mg | INTRAMUSCULAR | Status: DC | PRN
  Administered 2024-06-30 – 2024-07-01 (×3): 1 mg via INTRAVENOUS
  Filled 2024-06-30 (×3): qty 1

## 2024-06-30 MED ORDER — METHOCARBAMOL 500 MG PO TABS
1000.0000 mg | ORAL_TABLET | Freq: Three times a day (TID) | ORAL | Status: DC
  Administered 2024-06-30 – 2024-07-01 (×3): 1000 mg via ORAL
  Filled 2024-06-30 (×3): qty 2

## 2024-06-30 MED ADMIN — Enoxaparin Sodium Inj Soln Pref Syr 30 MG/0.3ML: 30 mg | SUBCUTANEOUS | NDC 71288043280

## 2024-06-30 MED ADMIN — Propranolol HCl Tab 20 MG: 20 mg | ORAL | NDC 69238207801

## 2024-06-30 MED ADMIN — Propranolol HCl Tab 20 MG: 20 mg | ORAL | NDC 60687059811

## 2024-06-30 MED ADMIN — Aspirin Tab Delayed Release 81 MG: 81 mg | ORAL | NDC 10135072962

## 2024-06-30 MED FILL — Aspirin Tab Delayed Release 81 MG: 81.0000 mg | ORAL | Qty: 1 | Status: AC
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
## 2024-06-30 NOTE — Progress Notes (Signed)
  Progress Note    06/30/2024 8:26 AM 1 Day Post-Op  Subjective: Having pain right leg below the knee, above the knee nonpainful  Vitals:   06/30/24 0328 06/30/24 0736  BP: 126/68 (!) 133/90  Pulse: (!) 114 (!) 106  Resp: 12 11  Temp: 98.6 F (37 C) 98.3 F (36.8 C)  SpO2: 91% 94%    Physical Exam: Awake alert and oriented Right thigh is soft dressing in place on the medial aspect Right lower extremity somewhat tighter laterally with dressing in place no evidence of hematoma medially there is a wound VAC to suction 2+ palpable right posterior tibial pulse Patient has sensation intact to right foot but has foot drop of the right foot and cannot dorsiflex  CBC    Component Value Date/Time   WBC 10.3 06/30/2024 0513   RBC 2.62 (L) 06/30/2024 0513   HGB 8.0 (L) 06/30/2024 0513   HCT 24.2 (L) 06/30/2024 0513   PLT 149 (L) 06/30/2024 0513   MCV 92.4 06/30/2024 0513   MCH 30.5 06/30/2024 0513   MCHC 33.1 06/30/2024 0513   RDW 13.4 06/30/2024 0513   LYMPHSABS 3.6 06/26/2024 2346   MONOABS 0.7 06/26/2024 2346   EOSABS 0.2 06/26/2024 2346   BASOSABS 0.0 06/26/2024 2346    BMET    Component Value Date/Time   NA 138 06/30/2024 0513   K 4.3 06/30/2024 0513   CL 103 06/30/2024 0513   CO2 28 06/30/2024 0513   GLUCOSE 113 (H) 06/30/2024 0513   BUN 5 (L) 06/30/2024 0513   CREATININE 1.53 (H) 06/30/2024 0513   CALCIUM  8.4 (L) 06/30/2024 0513   GFRNONAA >60 06/30/2024 0513    INR    Component Value Date/Time   INR 1.1 06/27/2024 0856     Intake/Output Summary (Last 24 hours) at 06/30/2024 0826 Last data filed at 06/30/2024 0532 Gross per 24 hour  Intake 910 ml  Output 3490 ml  Net -2580 ml     Assessment:  28 y.o. male is s/p Procedure Performed: 1.  Percutaneous ultrasound-guided cannulation and Pro-glide device closure left common femoral artery 2.  Selection of right common femoral artery and right lower extremity angiography 3.  Stent right SFA with 6 mm  x 5 cm Viabahn postdilated with 5 mm balloon 4.  Right lower extremity 4 compartment fasciotomies 5.  Washout right thigh hematoma 6.  Negative pressure dressing with wound VAC right medial thigh, right medial and lateral leg  Plan: Okay for activity as tolerated Next VAC change at bedside either tomorrow or Friday Aspirin  ordered Will order foot drop boot    Gyan Cambre C. Sheree, MD [facility] Office: [phone] Pager: [phone]  06/30/2024 8:26 AM

## 2024-06-30 NOTE — Progress Notes (Signed)
  Progress Note   Date: 06/30/2024  Patient Name: Carlos Russell        MRN#: [id number]  Clarification of diagnosis:acute ventilator dependent respiratory failure was present but improved with treatment allowing extubation
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
## 2024-06-30 NOTE — Progress Notes (Signed)
 Orthopedic Tech Progress Note Patient Details:  Carlos Russell 1996/06/27 [id number]  Ortho Devices Type of Ortho Device: Prafo boot/shoe Ortho Device/Splint Location: RLE Ortho Device/Splint Interventions: Ordered, Other (comment)   PT was getting ready to work with patient and stated he would apply PRAFO BOOT once he finished his PT SESSION  Post Interventions Patient Tolerated: Well Instructions Provided: Care of device  Delanna LITTIE Pac 06/30/2024, 9:25 AM

## 2024-07-01 LAB — CBC
HCT: 24.2 % — ABNORMAL LOW (ref 39.0–52.0)
Hemoglobin: 7.9 g/dL — ABNORMAL LOW (ref 13.0–17.0)
MCH: 31 pg (ref 26.0–34.0)
MCHC: 32.6 g/dL (ref 30.0–36.0)
MCV: 94.9 fL (ref 80.0–100.0)
Platelets: 185 K/uL (ref 150–400)
RBC: 2.55 MIL/uL — ABNORMAL LOW (ref 4.22–5.81)
RDW: 13.8 % (ref 11.5–15.5)
WBC: 11.6 K/uL — ABNORMAL HIGH (ref 4.0–10.5)
nRBC: 0.3 % — ABNORMAL HIGH (ref 0.0–0.2)

## 2024-07-01 LAB — BASIC METABOLIC PANEL WITH GFR
Anion gap: 6 (ref 5–15)
BUN: 7 mg/dL (ref 6–20)
CO2: 25 mmol/L (ref 22–32)
Calcium: 8.3 mg/dL — ABNORMAL LOW (ref 8.9–10.3)
Chloride: 105 mmol/L (ref 98–111)
Creatinine, Ser: 0.91 mg/dL (ref 0.61–1.24)
GFR, Estimated: >60 mL/min (ref >60)
Glucose, Bld: 82 mg/dL (ref 70–99)
Potassium: 4.8 mmol/L (ref 3.5–5.1)
Sodium: 136 mmol/L (ref 135–145)

## 2024-07-01 LAB — GLUCOSE, CAPILLARY
Glucose-Capillary: 132 mg/dL — ABNORMAL HIGH (ref 70–99)
Glucose-Capillary: 99 mg/dL (ref 70–99)
Glucose-Capillary: 100 mg/dL — ABNORMAL HIGH (ref 70–99)
Glucose-Capillary: 136 mg/dL — ABNORMAL HIGH (ref 70–99)
Glucose-Capillary: 113 mg/dL — ABNORMAL HIGH (ref 70–99)

## 2024-07-01 LAB — PHOSPHORUS: Phosphorus: 2.9 mg/dL (ref 2.5–4.6)

## 2024-07-01 LAB — TRIGLYCERIDES: Triglycerides: 103 mg/dL (ref <150)

## 2024-07-01 LAB — MAGNESIUM: Magnesium: 1.9 mg/dL (ref 1.7–2.4)

## 2024-07-01 MED ORDER — METHOCARBAMOL 500 MG PO TABS
1000.0000 mg | ORAL_TABLET | Freq: Four times a day (QID) | ORAL | Status: DC
  Administered 2024-07-01 – 2024-07-07 (×25): 1000 mg via ORAL
  Filled 2024-07-01 (×25): qty 2

## 2024-07-01 MED ORDER — HYDROMORPHONE HCL 1 MG/ML IJ SOLN
0.5000 mg | INTRAMUSCULAR | Status: DC | PRN
  Administered 2024-07-01 – 2024-07-02 (×3): 0.5 mg via INTRAVENOUS
  Filled 2024-07-01 (×3): qty 0.5

## 2024-07-01 MED ADMIN — Enoxaparin Sodium Inj Soln Pref Syr 30 MG/0.3ML: 30 mg | SUBCUTANEOUS | NDC 71288043280

## 2024-07-01 MED ADMIN — Propranolol HCl Tab 20 MG: 20 mg | ORAL | NDC 69238207801

## 2024-07-01 MED ADMIN — Propranolol HCl Tab 20 MG: 20 mg | ORAL | NDC 60687059811

## 2024-07-01 MED ADMIN — Aspirin Tab Delayed Release 81 MG: 81 mg | ORAL | NDC 10135072962
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
## 2024-07-01 NOTE — Progress Notes (Signed)
  Progress Note    07/01/2024 7:37 AM 2 Days Post-Op  Subjective: No pain in the right thigh, having some pain in the right leg and foot  Vitals:   07/01/24 0331 07/01/24 0725  BP: 138/79 (!) 150/73  Pulse: 81 (!) 102  Resp: 17   Temp: 98.3 F (36.8 C) 98.2 F (36.8 C)  SpO2: 96%     Physical Exam: Awake alert and oriented Right thigh is soft dressing in place medially Right lateral leg somewhat tense but soft and no pain with pressure in the lateral and anterior compartments, medially there is a wound VAC in place Improving edema right lower extremity including the foot with a palpable posterior tibial pulse  CBC    Component Value Date/Time   WBC 11.6 (H) 07/01/2024 0634   RBC 2.55 (L) 07/01/2024 0634   HGB 7.9 (L) 07/01/2024 0634   HCT 24.2 (L) 07/01/2024 0634   PLT 185 07/01/2024 0634   MCV 94.9 07/01/2024 0634   MCH 31.0 07/01/2024 0634   MCHC 32.6 07/01/2024 0634   RDW 13.8 07/01/2024 0634   LYMPHSABS 3.6 06/26/2024 2346   MONOABS 0.7 06/26/2024 2346   EOSABS 0.2 06/26/2024 2346   BASOSABS 0.0 06/26/2024 2346    BMET    Component Value Date/Time   NA 138 06/30/2024 0513   K 4.3 06/30/2024 0513   CL 103 06/30/2024 0513   CO2 28 06/30/2024 0513   GLUCOSE 113 (H) 06/30/2024 0513   BUN 5 (L) 06/30/2024 0513   CREATININE 1.53 (H) 06/30/2024 0513   CALCIUM  8.4 (L) 06/30/2024 0513   GFRNONAA >60 06/30/2024 0513    INR    Component Value Date/Time   INR 1.1 06/27/2024 0856     Intake/Output Summary (Last 24 hours) at 07/01/2024 0737 Last data filed at 07/01/2024 0726 Gross per 24 hour  Intake 960 ml  Output 4400 ml  Net -3440 ml    Assessment:  28 y.o. male is s/p Procedure Performed: 1.  Percutaneous ultrasound-guided cannulation and Pro-glide device closure left common femoral artery 2.  Selection of right common femoral artery and right lower extremity angiography 3.  Stent right SFA with 6 mm x 5 cm Viabahn postdilated with 5 mm balloon 4.   Right lower extremity 4 compartment fasciotomies 5.  Washout right thigh hematoma 6.  Negative pressure dressing with wound VAC right medial thigh, right medial and lateral leg  Plan: Will need wound VAC changed  Continue aspirin   okay for discharge from vascular standpoint as long as his wound appears to be healing   Gerre Ranum C. Sheree, MD [facility] [place] Office: [phone] Pager: [phone]  07/01/2024 7:37 AM

## 2024-07-01 NOTE — Plan of Care (Signed)

## 2024-07-02 LAB — GLUCOSE, CAPILLARY
Glucose-Capillary: 104 mg/dL — ABNORMAL HIGH (ref 70–99)
Glucose-Capillary: 111 mg/dL — ABNORMAL HIGH (ref 70–99)
Glucose-Capillary: 92 mg/dL (ref 70–99)

## 2024-07-02 MED ORDER — PROPRANOLOL HCL 20 MG PO TABS
30.0000 mg | ORAL_TABLET | Freq: Three times a day (TID) | ORAL | Status: DC
  Administered 2024-07-02 – 2024-07-07 (×14): 30 mg via ORAL
  Filled 2024-07-02 (×18): qty 1

## 2024-07-02 MED ORDER — TRAMADOL HCL 50 MG PO TABS
50.0000 mg | ORAL_TABLET | Freq: Four times a day (QID) | ORAL | Status: DC
  Administered 2024-07-02 – 2024-07-05 (×12): 50 mg via ORAL
  Filled 2024-07-02 (×12): qty 1

## 2024-07-02 MED ADMIN — Divalproex Sodium Tab Delayed Release 250 MG: 250 mg | ORAL | NDC 00904686061

## 2024-07-02 MED ADMIN — Divalproex Sodium Tab Delayed Release 250 MG: 250 mg | ORAL | NDC 57237004701

## 2024-07-02 MED ADMIN — Enoxaparin Sodium Inj Soln Pref Syr 30 MG/0.3ML: 30 mg | SUBCUTANEOUS | NDC 71288043280

## 2024-07-02 MED ADMIN — Hydromorphone HCl Inj 1 MG/ML: 0.5 mg | INTRAVENOUS | NDC 76045000996

## 2024-07-02 MED ADMIN — Propranolol HCl Tab 20 MG: 20 mg | ORAL | NDC 69238207801

## 2024-07-02 MED ADMIN — Aspirin Tab Delayed Release 81 MG: 81 mg | ORAL | NDC 10135072962

## 2024-07-02 MED FILL — Divalproex Sodium Tab Delayed Release 250 MG: 250.0000 mg | ORAL | Qty: 1 | Status: AC

## 2024-07-02 MED FILL — Hydromorphone HCl Inj 1 MG/ML: 0.5000 mg | INTRAMUSCULAR | Qty: 0.5 | Status: AC
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
## 2024-07-02 NOTE — TOC Progression Note (Signed)
 Transition of Care [facility]) - Progression Note    Patient Details  Name: Carlos Russell MRN: [id number] Date of Birth: 1996/01/07  [facility] CM/SW Contact  Pama Roskos E Anadelia Kintz, LCSW Phone Number: 07/02/2024, 11:13 AM  Clinical Narrative:    Reached out to Psychiatry who states they will assess patient today to confirm recs of inpatient psych versus outpatient follow up.   Expected Discharge Plan: [facility] Barriers to Discharge: Continued Medical Work up               Expected Discharge Plan and Services                                               Social Drivers of Health (SDOH) Interventions    Readmission Risk Interventions     No data to display
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
## 2024-07-02 NOTE — Plan of Care (Signed)

## 2024-07-02 NOTE — TOC Progression Note (Addendum)
 Transition of Care [facility]) - Progression Note    Patient Details  Name: Carlos Russell MRN: [id number] Date of Birth: [birth year]  Transition of Care [facility] CM/SW Contact  [name] E [name], LCSW Phone Number: 07/02/2024, 11:46 AM  Clinical Narrative:    Psych rec clarified for Inpatient Psych.   [facility] has requested the following on 9/25 to see if they can take patient at [facility] or [facility]: -wound vac removal, can do wet to dry -pain meds switched to PO -IV fluids stopped -DC Summary -uploaded photos of the faciotomy    Expected Discharge Plan: Psychiatric Hospital Barriers to Discharge: Continued Medical Work up               Expected Discharge Plan and Services                                               Social Drivers of Health (SDOH) Interventions    Readmission Risk Interventions     No data to display

## 2024-07-03 DIAGNOSIS — W3400XA Accidental discharge from unspecified firearms or gun, initial encounter: Secondary | ICD-10-CM

## 2024-07-03 DIAGNOSIS — F322 Major depressive disorder, single episode, severe without psychotic features: Secondary | ICD-10-CM | POA: Diagnosis not present

## 2024-07-03 DIAGNOSIS — S71101A Unspecified open wound, right thigh, initial encounter: Secondary | ICD-10-CM

## 2024-07-03 DIAGNOSIS — F3160 Bipolar disorder, current episode mixed, unspecified: Secondary | ICD-10-CM | POA: Diagnosis present

## 2024-07-03 DIAGNOSIS — F3131 Bipolar disorder, current episode depressed, mild: Secondary | ICD-10-CM | POA: Diagnosis present

## 2024-07-03 MED ORDER — DOCUSATE SODIUM 100 MG PO CAPS
100.0000 mg | ORAL_CAPSULE | Freq: Every day | ORAL | Status: DC
  Administered 2024-07-03 – 2024-07-07 (×5): 100 mg via ORAL
  Filled 2024-07-03 (×5): qty 1

## 2024-07-03 MED ADMIN — Divalproex Sodium Tab Delayed Release 250 MG: 250 mg | ORAL | NDC 57237004701

## 2024-07-03 MED ADMIN — Enoxaparin Sodium Inj Soln Pref Syr 30 MG/0.3ML: 30 mg | SUBCUTANEOUS | NDC 71288043280

## 2024-07-03 MED ADMIN — Hydromorphone HCl Inj 1 MG/ML: 0.5 mg | INTRAVENOUS | NDC 76045000996

## 2024-07-03 MED ADMIN — Aspirin Tab Delayed Release 81 MG: 81 mg | ORAL | NDC 10135072962

## 2024-07-04 MED ORDER — MELATONIN 3 MG PO TABS
3.0000 mg | ORAL_TABLET | Freq: Every day | ORAL | Status: DC
  Administered 2024-07-04 – 2024-07-06 (×3): 3 mg via ORAL
  Filled 2024-07-04 (×3): qty 1

## 2024-07-04 MED ADMIN — Divalproex Sodium Tab Delayed Release 250 MG: 250 mg | ORAL | NDC 57237004701

## 2024-07-04 MED ADMIN — Enoxaparin Sodium Inj Soln Pref Syr 30 MG/0.3ML: 30 mg | SUBCUTANEOUS | NDC 71288043280

## 2024-07-04 MED ADMIN — Hydromorphone HCl Inj 1 MG/ML: 0.5 mg | INTRAVENOUS | NDC 76045000996

## 2024-07-04 MED ADMIN — Aspirin Tab Delayed Release 81 MG: 81 mg | ORAL | NDC 10135072962

## 2024-07-05 MED ORDER — KETOROLAC TROMETHAMINE 15 MG/ML IJ SOLN
30.0000 mg | Freq: Four times a day (QID) | INTRAMUSCULAR | Status: DC
  Administered 2024-07-05 – 2024-07-07 (×9): 30 mg via INTRAVENOUS
  Filled 2024-07-05 (×9): qty 2

## 2024-07-05 MED ORDER — GABAPENTIN 300 MG PO CAPS
600.0000 mg | ORAL_CAPSULE | Freq: Four times a day (QID) | ORAL | Status: DC
  Administered 2024-07-05 – 2024-07-07 (×10): 600 mg via ORAL
  Filled 2024-07-05 (×10): qty 2

## 2024-07-05 MED ORDER — TRAMADOL HCL 50 MG PO TABS
100.0000 mg | ORAL_TABLET | Freq: Four times a day (QID) | ORAL | Status: DC
  Administered 2024-07-05 – 2024-07-07 (×9): 100 mg via ORAL
  Filled 2024-07-05 (×9): qty 2

## 2024-07-05 MED ORDER — OXYCODONE HCL ER 15 MG PO T12A
15.0000 mg | EXTENDED_RELEASE_TABLET | Freq: Two times a day (BID) | ORAL | Status: DC
  Administered 2024-07-05 – 2024-07-07 (×5): 15 mg via ORAL
  Filled 2024-07-05 (×5): qty 1

## 2024-07-05 MED ADMIN — Divalproex Sodium Tab Delayed Release 250 MG: 250 mg | ORAL | NDC 57237004701

## 2024-07-05 MED ADMIN — Enoxaparin Sodium Inj Soln Pref Syr 30 MG/0.3ML: 30 mg | SUBCUTANEOUS | NDC 71288043280

## 2024-07-05 MED ADMIN — Hydromorphone HCl Inj 1 MG/ML: 0.5 mg | INTRAVENOUS | NDC 76045000996

## 2024-07-05 MED ADMIN — Aspirin Tab Delayed Release 81 MG: 81 mg | ORAL | NDC 10135072962

## 2024-07-06 MED ADMIN — Divalproex Sodium Tab Delayed Release 250 MG: 250 mg | ORAL | NDC 57237004701

## 2024-07-06 MED ADMIN — Enoxaparin Sodium Inj Soln Pref Syr 30 MG/0.3ML: 30 mg | SUBCUTANEOUS | NDC 71288043280

## 2024-07-06 MED ADMIN — Hydromorphone HCl Inj 1 MG/ML: 0.5 mg | INTRAVENOUS | NDC 76045000996

## 2024-07-06 MED ADMIN — Aspirin Tab Delayed Release 81 MG: 81 mg | ORAL | NDC 10135072962

## 2024-07-07 ENCOUNTER — Other Ambulatory Visit (HOSPITAL_COMMUNITY): Payer: Self-pay

## 2024-07-07 MED ORDER — DIVALPROEX SODIUM 250 MG PO DR TAB
250.0000 mg | DELAYED_RELEASE_TABLET | Freq: Two times a day (BID) | ORAL | 0 refills | Status: AC
  Filled 2024-07-07: qty 60, 30d supply, fill #0

## 2024-07-07 MED ORDER — OXYCODONE HCL 10 MG PO TABS
10.0000 mg | ORAL_TABLET | Freq: Four times a day (QID) | ORAL | 0 refills | Status: AC | PRN
  Filled 2024-07-07: qty 30, 5d supply, fill #0

## 2024-07-07 MED ORDER — ACETAMINOPHEN 500 MG PO TABS: 1000.0000 mg | ORAL_TABLET | Freq: Three times a day (TID) | ORAL | Status: AC | PRN

## 2024-07-07 MED ORDER — OXYCODONE HCL ER 20 MG PO T12A
20.0000 mg | EXTENDED_RELEASE_TABLET | Freq: Two times a day (BID) | ORAL | 0 refills | Status: AC
  Filled 2024-07-07: qty 14, 7d supply, fill #0

## 2024-07-07 MED ORDER — THIAMINE HCL 100 MG PO TABS
100.0000 mg | ORAL_TABLET | Freq: Every day | ORAL | 0 refills | Status: AC
  Filled 2024-07-07: qty 30, 30d supply, fill #0

## 2024-07-07 MED ORDER — OXYCODONE HCL ER 10 MG PO T12A: 20.0000 mg | EXTENDED_RELEASE_TABLET | Freq: Two times a day (BID) | ORAL | Status: DC

## 2024-07-07 MED ORDER — POLYETHYLENE GLYCOL 3350 17 G PO PACK: 17.0000 g | PACK | Freq: Two times a day (BID) | ORAL | Status: AC | PRN

## 2024-07-07 MED ORDER — ASPIRIN 81 MG PO TBEC
81.0000 mg | DELAYED_RELEASE_TABLET | Freq: Every day | ORAL | 0 refills | Status: AC
  Filled 2024-07-07: qty 90, 90d supply, fill #0

## 2024-07-07 MED ORDER — HYDROMORPHONE HCL 1 MG/ML IJ SOLN: 0.2500 mg | Freq: Three times a day (TID) | INTRAMUSCULAR | Status: DC | PRN

## 2024-07-07 MED ORDER — PROPRANOLOL HCL 10 MG PO TABS
30.0000 mg | ORAL_TABLET | Freq: Three times a day (TID) | ORAL | 0 refills | Status: AC
  Filled 2024-07-07: qty 203, 23d supply, fill #0
  Filled 2024-07-07: qty 270, 30d supply, fill #0

## 2024-07-07 MED ORDER — GABAPENTIN 300 MG PO CAPS
600.0000 mg | ORAL_CAPSULE | Freq: Four times a day (QID) | ORAL | 0 refills | Status: DC
  Filled 2024-07-07: qty 240, 30d supply, fill #0

## 2024-07-07 MED ORDER — TRAMADOL HCL 50 MG PO TABS
100.0000 mg | ORAL_TABLET | Freq: Four times a day (QID) | ORAL | 0 refills | Status: AC | PRN
  Filled 2024-07-07: qty 30, 6d supply, fill #0

## 2024-07-07 MED ORDER — DOCUSATE SODIUM 100 MG PO CAPS: 100.0000 mg | ORAL_CAPSULE | Freq: Two times a day (BID) | ORAL | Status: AC | PRN

## 2024-07-07 MED ORDER — PROPRANOLOL HCL 10 MG PO TABS
30.0000 mg | ORAL_TABLET | Freq: Three times a day (TID) | ORAL | Status: DC
  Administered 2024-07-07: 30 mg via ORAL
  Filled 2024-07-07 (×2): qty 3

## 2024-07-07 MED ORDER — METHOCARBAMOL 500 MG PO TABS
1000.0000 mg | ORAL_TABLET | Freq: Four times a day (QID) | ORAL | 0 refills | Status: AC | PRN
  Filled 2024-07-07: qty 80, 10d supply, fill #0

## 2024-07-07 MED ORDER — FOLIC ACID 1 MG PO TABS
1.0000 mg | ORAL_TABLET | Freq: Every day | ORAL | 0 refills | Status: AC
  Filled 2024-07-07: qty 30, 30d supply, fill #0

## 2024-07-07 MED ADMIN — Divalproex Sodium Tab Delayed Release 250 MG: 250 mg | ORAL | NDC 57237004701

## 2024-07-07 MED ADMIN — Enoxaparin Sodium Inj Soln Pref Syr 30 MG/0.3ML: 30 mg | SUBCUTANEOUS | NDC 71288043280

## 2024-07-07 MED ADMIN — Hydromorphone HCl Inj 1 MG/ML: 0.5 mg | INTRAVENOUS | NDC 76045000996

## 2024-07-07 MED ADMIN — Aspirin Tab Delayed Release 81 MG: 81 mg | ORAL | NDC 10135072962
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
## 2024-07-08 ENCOUNTER — Other Ambulatory Visit (HOSPITAL_COMMUNITY): Payer: Self-pay

## 2024-07-08 DIAGNOSIS — F319 Bipolar disorder, unspecified: Secondary | ICD-10-CM | POA: Diagnosis not present

## 2024-07-12 ENCOUNTER — Ambulatory Visit (HOSPITAL_COMMUNITY): Payer: Self-pay | Admitting: Licensed Clinical Social Worker

## 2024-07-12 DIAGNOSIS — S069XAA Unspecified intracranial injury with loss of consciousness status unknown, initial encounter: Secondary | ICD-10-CM
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
## 2024-07-15 ENCOUNTER — Telehealth: Payer: Self-pay

## 2024-07-15 NOTE — Telephone Encounter (Signed)
 Medication Refill:  -pt LM stating he needed a refill on his pain medicine. -returned call to pt who states that he hasn't finished all of it, he has a few pills left but didn't want to run out.   -pt discharged with medications via general surgery.   -consulted with PA in office, medications may be discussed during f/u visit on 10/22 as scheduled

## 2024-07-16 ENCOUNTER — Telehealth: Payer: Self-pay
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
## 2024-07-21 ENCOUNTER — Ambulatory Visit: Attending: Physician Assistant | Admitting: Rehabilitation

## 2024-07-21 ENCOUNTER — Other Ambulatory Visit: Payer: Self-pay

## 2024-07-21 ENCOUNTER — Encounter: Payer: Self-pay | Admitting: Rehabilitation

## 2024-07-21 DIAGNOSIS — R2689 Other abnormalities of gait and mobility: Secondary | ICD-10-CM | POA: Diagnosis not present

## 2024-07-21 DIAGNOSIS — M79661 Pain in right lower leg: Secondary | ICD-10-CM | POA: Diagnosis not present

## 2024-07-21 DIAGNOSIS — Y249XXD Unspecified firearm discharge, undetermined intent, subsequent encounter: Secondary | ICD-10-CM | POA: Diagnosis not present

## 2024-07-21 DIAGNOSIS — S71131D Puncture wound without foreign body, right thigh, subsequent encounter: Secondary | ICD-10-CM | POA: Insufficient documentation

## 2024-07-21 DIAGNOSIS — M6281 Muscle weakness (generalized): Secondary | ICD-10-CM | POA: Diagnosis not present

## 2024-07-28 ENCOUNTER — Ambulatory Visit: Payer: Self-pay | Attending: Surgery | Admitting: Physician Assistant

## 2024-07-28 VITALS — BP 127/83 | HR 62 | Temp 97.7°F | Wt 234.4 lb

## 2024-07-28 DIAGNOSIS — S81801S Unspecified open wound, right lower leg, sequela: Secondary | ICD-10-CM

## 2024-07-28 DIAGNOSIS — Y249XXA Unspecified firearm discharge, undetermined intent, initial encounter: Secondary | ICD-10-CM
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
## 2024-08-04 ENCOUNTER — Ambulatory Visit: Attending: Vascular Surgery | Admitting: Physician Assistant

## 2024-08-04 ENCOUNTER — Other Ambulatory Visit (HOSPITAL_COMMUNITY): Payer: Self-pay

## 2024-08-04 ENCOUNTER — Encounter: Payer: Self-pay | Admitting: Physician Assistant

## 2024-08-04 VITALS — BP 117/79 | HR 82 | Temp 97.9°F | Wt 236.3 lb

## 2024-08-04 DIAGNOSIS — Y249XXA Unspecified firearm discharge, undetermined intent, initial encounter: Secondary | ICD-10-CM

## 2024-08-04 DIAGNOSIS — S81801S Unspecified open wound, right lower leg, sequela: Secondary | ICD-10-CM

## 2024-08-04 MED ORDER — CLOPIDOGREL BISULFATE 75 MG PO TABS
75.0000 mg | ORAL_TABLET | Freq: Every day | ORAL | 3 refills | Status: AC
  Filled 2024-08-04 – 2024-09-09 (×2): qty 30, 30d supply, fill #0
  Filled 2024-10-04: qty 90, 90d supply, fill #1
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
## 2024-08-10 ENCOUNTER — Telehealth: Payer: Self-pay

## 2024-08-10 NOTE — Telephone Encounter (Signed)
 Pt called to obtain a 'return to work' note for light duty, as written in his last office visit note from last week. 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 I have sent him his note via MyChart and he is aware. No questions/concerns at this time.

## 2024-09-09 ENCOUNTER — Other Ambulatory Visit (HOSPITAL_BASED_OUTPATIENT_CLINIC_OR_DEPARTMENT_OTHER): Payer: Self-pay

## 2024-09-22 ENCOUNTER — Ambulatory Visit: Attending: Surgery

## 2024-09-22 VITALS — BP 133/86 | HR 69 | Temp 98.4°F | Wt 244.0 lb

## 2024-09-22 DIAGNOSIS — S81801S Unspecified open wound, right lower leg, sequela: Secondary | ICD-10-CM

## 2024-09-22 DIAGNOSIS — Y249XXA Unspecified firearm discharge, undetermined intent, initial encounter: Secondary | ICD-10-CM
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
## 2024-09-23 ENCOUNTER — Other Ambulatory Visit: Payer: Self-pay | Admitting: *Deleted

## 2024-09-23 DIAGNOSIS — Y249XXA Unspecified firearm discharge, undetermined intent, initial encounter: Secondary | ICD-10-CM

## 2024-10-04 ENCOUNTER — Other Ambulatory Visit (HOSPITAL_COMMUNITY): Payer: Self-pay

## 2024-10-05 ENCOUNTER — Other Ambulatory Visit: Payer: Self-pay

## 2024-10-05 ENCOUNTER — Encounter: Payer: Self-pay | Admitting: Pharmacist

## 2024-10-11 ENCOUNTER — Other Ambulatory Visit: Payer: Self-pay

## 2024-11-03 ENCOUNTER — Ambulatory Visit (HOSPITAL_COMMUNITY)
Admission: RE | Admit: 2024-11-03 | Discharge: 2024-11-03 | Disposition: A | Source: Ambulatory Visit | Attending: Vascular Surgery

## 2024-11-03 ENCOUNTER — Other Ambulatory Visit: Payer: Self-pay | Admitting: Physician Assistant

## 2024-11-03 ENCOUNTER — Ambulatory Visit: Admitting: Physician Assistant

## 2024-11-03 ENCOUNTER — Ambulatory Visit (HOSPITAL_COMMUNITY)
Admission: RE | Admit: 2024-11-03 | Discharge: 2024-11-03 | Disposition: A | Discharge status: Home / Self Care | Source: Ambulatory Visit | Attending: Vascular Surgery | Admitting: Vascular Surgery

## 2024-11-03 VITALS — BP 140/83 | HR 80 | Temp 98.7°F | Resp 16 | Ht 72.0 in | Wt 243.7 lb

## 2024-11-03 DIAGNOSIS — R200 Anesthesia of skin: Secondary | ICD-10-CM | POA: Diagnosis not present

## 2024-11-03 DIAGNOSIS — S81801S Unspecified open wound, right lower leg, sequela: Secondary | ICD-10-CM | POA: Diagnosis present

## 2024-11-03 DIAGNOSIS — Y249XXA Unspecified firearm discharge, undetermined intent, initial encounter: Secondary | ICD-10-CM | POA: Diagnosis not present

## 2024-11-03 DIAGNOSIS — Z7902 Long term (current) use of antithrombotics/antiplatelets: Secondary | ICD-10-CM | POA: Diagnosis not present

## 2024-11-03 DIAGNOSIS — Z79899 Other long term (current) drug therapy: Secondary | ICD-10-CM | POA: Diagnosis not present

## 2024-11-03 DIAGNOSIS — Z7982 Long term (current) use of aspirin: Secondary | ICD-10-CM | POA: Diagnosis not present

## 2024-11-03 DIAGNOSIS — M792 Neuralgia and neuritis, unspecified: Secondary | ICD-10-CM | POA: Diagnosis not present

## 2024-11-03 LAB — VAS US ABI WITH/WO TBI
Left ABI: 1.17
Right ABI: 1.26

## 2024-11-03 MED ORDER — GABAPENTIN 300 MG PO CAPS: 600.0000 mg | ORAL_CAPSULE | Freq: Every evening | ORAL | 0 refills | Status: AC | PRN
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
## 2024-11-05 ENCOUNTER — Other Ambulatory Visit: Payer: Self-pay | Admitting: *Deleted

## 2024-11-05 DIAGNOSIS — Y249XXA Unspecified firearm discharge, undetermined intent, initial encounter: Secondary | ICD-10-CM

## 2024-11-05 DIAGNOSIS — S81801S Unspecified open wound, right lower leg, sequela: Secondary | ICD-10-CM

## 2025-05-04 ENCOUNTER — Ambulatory Visit (HOSPITAL_COMMUNITY)

## 2025-05-04 ENCOUNTER — Ambulatory Visit
